# Patient Record
Sex: Female | Born: 1935 | Race: White | Hispanic: No | Marital: Married | Smoking: Former smoker
Health system: Southern US, Community
[De-identification: ages and names within clinical notes are randomized; demographics above are authoritative.]

## PROBLEM LIST (undated history)

## (undated) DIAGNOSIS — C439 Malignant melanoma of skin, unspecified: Secondary | ICD-10-CM

## (undated) DIAGNOSIS — J45909 Unspecified asthma, uncomplicated: Secondary | ICD-10-CM

## (undated) DIAGNOSIS — E7800 Pure hypercholesterolemia, unspecified: Secondary | ICD-10-CM

## (undated) DIAGNOSIS — J449 Chronic obstructive pulmonary disease, unspecified: Secondary | ICD-10-CM

## (undated) DIAGNOSIS — I10 Essential (primary) hypertension: Secondary | ICD-10-CM

## (undated) DIAGNOSIS — J302 Other seasonal allergic rhinitis: Secondary | ICD-10-CM

## (undated) DIAGNOSIS — I2510 Atherosclerotic heart disease of native coronary artery without angina pectoris: Secondary | ICD-10-CM

## (undated) HISTORY — PX: EYE SURGERY: SHX253

## (undated) HISTORY — PX: ABDOMINAL HYSTERECTOMY: SHX81

## (undated) HISTORY — DX: Malignant melanoma of skin, unspecified: C43.9

## (undated) HISTORY — DX: Essential (primary) hypertension: I10

## (undated) HISTORY — DX: Atherosclerotic heart disease of native coronary artery without angina pectoris: I25.10

## (undated) HISTORY — DX: Pure hypercholesterolemia, unspecified: E78.00

## (undated) HISTORY — DX: Other seasonal allergic rhinitis: J30.2

## (undated) HISTORY — PX: CHOLECYSTECTOMY: SHX55

## (undated) HISTORY — DX: Chronic obstructive pulmonary disease, unspecified: J44.9

## (undated) HISTORY — PX: OTHER SURGICAL HISTORY: SHX169

## (undated) HISTORY — PX: MELANOMA EXCISION: SHX5266

---

## 2015-03-28 ENCOUNTER — Ambulatory Visit (INDEPENDENT_AMBULATORY_CARE_PROVIDER_SITE_OTHER): Payer: Medicare Other | Admitting: Internal Medicine

## 2015-03-28 ENCOUNTER — Encounter (INDEPENDENT_AMBULATORY_CARE_PROVIDER_SITE_OTHER): Payer: Self-pay

## 2015-03-28 ENCOUNTER — Encounter: Payer: Self-pay | Admitting: Internal Medicine

## 2015-03-28 VITALS — BP 142/68 | HR 80 | Ht 63.0 in | Wt 148.0 lb

## 2015-03-28 DIAGNOSIS — Z87891 Personal history of nicotine dependence: Secondary | ICD-10-CM | POA: Diagnosis not present

## 2015-03-28 DIAGNOSIS — R0600 Dyspnea, unspecified: Secondary | ICD-10-CM | POA: Diagnosis not present

## 2015-03-28 DIAGNOSIS — J189 Pneumonia, unspecified organism: Secondary | ICD-10-CM | POA: Diagnosis not present

## 2015-03-28 DIAGNOSIS — Z23 Encounter for immunization: Secondary | ICD-10-CM

## 2015-03-28 NOTE — Progress Notes (Signed)
Subjective:    Patient ID: Katie Hunter, female    DOB: August 11, 1930, 81 y.o.   MRN: [id number]  PCP Brigid Re, FNP   HPI  IOV 03/28/2015  Chief Complaint  Patient presents with  . Pulmonary Consult    Pt is a self referral for COPD. Pt stated she was diagnosed with COPD when she went to an UC a couple months ago. Pt c/o DOE, prod cough with clear and thick mucus. Pt denies CP/tightness.     81 year old female former 50 pack smoker. Presents with her daughter who is a Equities trader. They both live in [place], [place]. Patient reports that prior to December 2015 she was well with doing normal activities of daily living and walking for exercise. She quit smoking many years ago as noted below. Then since December 2015 she is reporting recurrent episodes of pneumonia. Both she and daughter at test at least 5 episodes of pneumonia. His happens every 6 weeks. Each episode is characterized by cough, white sputum, shortness of breath, feeling tired, eating less and sleeping all the time. Generally this no fever except for the most recent episode. Apparently chest x-ray is done each time at the primary care physician's office in [place] [no outside records available for me at this visit] and these have consistently demonstrated pneumonia. She is treated with antibodies and prednisone and gets temporarily better. In between episodes she is fine but then gets sick again. Currently she is on a prednisone course due to end in a couple of days and she is worried that she will have pneumonia again  Recurrent pneumonia associated history -Denies any aspiration. Does have acid reflux for which she is on Nexium and it is well controlled. Denies any autoimmune disease. Denies any dysphagia. Denies any potential hypersensitivity pneumonitis history. She lives in an old house built in [place] but denies any mold, mildew. There is a basement but she does not go there and she denies that it has mold. She does  not have any birds in the house.  Spirometry in the office today - Under the influence of prednisone is normal  Radiologic evaluation  - Not available  Outside records  = Not available   has a past medical history of COPD (chronic obstructive pulmonary disease); Hypercholesteremia; Hypertension; Seasonal allergies; and Melanoma.   reports that she quit smoking about 20 years ago. Her smoking use included Cigarettes. She has a 50 pack-year smoking history. She has never used smokeless tobacco.  Past Surgical History  Procedure Laterality Date  . Melanoma excision    . Left toe amputation    . Abdominal hysterectomy    . Cholecystectomy    . Eye surgery      BIL x 6   . Other surgical history      eye lid lift  . Skin cancer removal      on left upper eye lid    Allergies  Allergen Reactions  . Penicillins     Itching     Immunization History  Administered Date(s) Administered  . Pneumococcal-Unspecified 07/02/2013    Family History  Problem Relation Age of Onset  . Emphysema Father   . Heart disease Mother      Current outpatient prescriptions:  .  albuterol (PROAIR HFA) 108 (90 BASE) MCG/ACT inhaler, Inhale 1-2 puffs into the lungs every 6 (six) hours as needed for wheezing or shortness of breath., Disp: , Rfl:  .  albuterol (PROVENTIL) (2.5 MG/3ML) 0.083% nebulizer solution,  Take 2.5 mg by nebulization every 6 (six) hours as needed for wheezing or shortness of breath., Disp: , Rfl:  .  amLODipine (NORVASC) 5 MG tablet, Take 5 mg by mouth daily., Disp: , Rfl:  .  Ascorbic Acid (VITAMIN C) 1000 MG tablet, Take 1,000 mg by mouth daily., Disp: , Rfl:  .  aspirin 81 MG tablet, Take 81 mg by mouth daily., Disp: , Rfl:  .  atorvastatin (LIPITOR) 10 MG tablet, Take 10 mg by mouth daily., Disp: , Rfl:  .  celecoxib (CELEBREX) 200 MG capsule, Take 200 mg by mouth daily., Disp: , Rfl:  .  cholecalciferol (VITAMIN D) 1000 UNITS tablet, Take 1,000 Units by mouth daily.,  Disp: , Rfl:  .  esomeprazole (NEXIUM) 40 MG capsule, Take 40 mg by mouth daily at 12 noon., Disp: , Rfl:  .  ezetimibe (ZETIA) 10 MG tablet, Take 10 mg by mouth daily., Disp: , Rfl:  .  furosemide (LASIX) 40 MG tablet, Take 40 mg by mouth., Disp: , Rfl:  .  losartan (COZAAR) 50 MG tablet, Take 50 mg by mouth daily., Disp: , Rfl:  .  Multiple Vitamin (MULTIVITAMIN) capsule, Take 1 capsule by mouth daily., Disp: , Rfl:  .  predniSONE (DELTASONE) 10 MG tablet, Take 10 mg by mouth daily with breakfast., Disp: , Rfl:  .  zinc gluconate 50 MG tablet, Take 50 mg by mouth daily., Disp: , Rfl:      Review of Systems  Constitutional: Negative for fever and unexpected weight change.  HENT: Negative for congestion, dental problem, ear pain, nosebleeds, postnasal drip, rhinorrhea, sinus pressure, sneezing, sore throat and trouble swallowing.   Eyes: Negative for redness and itching.  Respiratory: Positive for cough and shortness of breath. Negative for chest tightness and wheezing.   Cardiovascular: Negative for palpitations and leg swelling.  Gastrointestinal: Negative for nausea and vomiting.  Genitourinary: Negative for dysuria.  Musculoskeletal: Negative for joint swelling.  Skin: Negative for rash.  Neurological: Negative for headaches.  Hematological: Does not bruise/bleed easily.  Psychiatric/Behavioral: Negative for dysphoric mood. The patient is not nervous/anxious.        Objective:   Physical Exam  Constitutional: She is oriented to person, place, and time. She appears well-developed and well-nourished. No distress.  Body mass index is 26.22 kg/(m^2).   HENT:  Head: Normocephalic and atraumatic.  Right Ear: External ear normal.  Left Ear: External ear normal.  Mouth/Throat: Oropharynx is clear and moist. No oropharyngeal exudate.  Eyes: Conjunctivae and EOM are normal. Pupils are equal, round, and reactive to light. Right eye exhibits no discharge. Left eye exhibits no discharge.  No scleral icterus.  Neck: Normal range of motion. Neck supple. No JVD present. No tracheal deviation present. No thyromegaly present.  Cardiovascular: Normal rate, regular rhythm, normal heart sounds and intact distal pulses.  Exam reveals no gallop and no friction rub.   No murmur heard. Pulmonary/Chest: Effort normal and breath sounds normal. No respiratory distress. She has no wheezes. She has no rales. She exhibits no tenderness.  Abdominal: Soft. Bowel sounds are normal. She exhibits no distension and no mass. There is no tenderness. There is no rebound and no guarding.  Musculoskeletal: Normal range of motion. She exhibits no edema or tenderness.  Lymphadenopathy:    She has no cervical adenopathy.  Neurological: She is alert and oriented to person, place, and time. She has normal reflexes. No cranial nerve deficit. She exhibits normal muscle tone. Coordination normal.  Skin:  Skin is warm and dry. No rash noted. She is not diaphoretic. No erythema. No pallor.  Psychiatric: She has a normal mood and affect. Her behavior is normal. Judgment and thought content normal.  Vitals reviewed.   Filed Vitals:   03/28/15 1203  BP: 142/68  Pulse: 80  Height: 5\' 3"  (1.6 m)  Weight: 148 lb (67.132 kg)  SpO2: 95%   Body mass index is 26.22 kg/(m^2).        Assessment & Plan:     ICD-9-CM ICD-10-CM   1. Recurrent pneumonia 486 J18.9 Pulmonary function test     CT Chest High Resolution  2. Dyspnea 786.09 R06.00 Spirometry with Graph  3. History of smoking 25-50 pack years V15.82 Z87.891 Pulmonary function test     CT Chest High Resolution    Cause remains unexplained Possibilities include asthma, copd, aspiration, auto-immune, local lung anatomy problems, rare issues like hypersensitivity pneumoniitis  PLAN - PFT at [place] [facility] - Flu shot 03/28/2015 - will need old records from Emilie Rutter, [place], FNP esp CXR report/images  Followup  - call us as soon as you  complete the test [phone] and have the triage nurse send message to me for review and advise on next step    Dr. Brand Males, M.D., [facility].C.P Pulmonary and Critical Care Medicine Staff Physician [facility] Pager: [phone], If no answer or between  15:00h - 7:00h: call [phone]  03/28/2015 12:38 PM

## 2015-03-28 NOTE — Patient Instructions (Signed)
ICD-9-CM ICD-10-CM   1. Recurrent pneumonia 486 J18.9   2. Dyspnea 786.09 R06.00 Spirometry with Graph  3. History of smoking 25-50 pack years V15.82 Z87.891     Cause remains unexplained Possibilities include asthma, copd, aspiration, auto-immune, local lung anatomy problems, rare issues like hypersensitivity pneumoniitis  PLAN - PFT at [facility] - Flu shot 03/28/2015  Followup  - call us as soon as you complete the test [phone] and have the triage nurse send message to me for review and advise on next step

## 2015-04-01 ENCOUNTER — Ambulatory Visit (HOSPITAL_COMMUNITY)
Admission: RE | Admit: 2015-04-01 | Discharge: 2015-04-01 | Disposition: A | Discharge status: Home / Self Care | Payer: Medicare Other | Source: Ambulatory Visit | Attending: Internal Medicine | Admitting: Internal Medicine

## 2015-04-01 ENCOUNTER — Telehealth: Payer: Self-pay | Admitting: Internal Medicine

## 2015-04-01 DIAGNOSIS — Z87891 Personal history of nicotine dependence: Secondary | ICD-10-CM

## 2015-04-01 DIAGNOSIS — J189 Pneumonia, unspecified organism: Secondary | ICD-10-CM | POA: Diagnosis present

## 2015-04-01 LAB — PULMONARY FUNCTION TEST
DL/VA % pred: 92 %
DL/VA: 4.34 ml/min/mmHg/L
DLCO unc % pred: 62 %
DLCO unc: 14.33 ml/min/mmHg
FEF 25-75 Post: 2.05 L/sec
FEF 25-75 Pre: 1.05 L/sec
FEF2575-%CHANGE-POST: 95 %
FEF2575-%Pred-Post: 166 %
FEF2575-%Pred-Pre: 85 %
FEV1-%CHANGE-POST: 17 %
FEV1-%PRED-POST: 84 %
FEV1-%PRED-PRE: 71 %
FEV1-PRE: 1.26 L
FEV1-Post: 1.49 L
FEV1FVC-%CHANGE-POST: 4 %
FEV1FVC-%Pred-Pre: 105 %
FEV6-%Change-Post: 11 %
FEV6-%PRED-PRE: 72 %
FEV6-%Pred-Post: 80 %
FEV6-PRE: 1.63 L
FEV6-Post: 1.82 L
FEV6FVC-%PRED-PRE: 106 %
FEV6FVC-%Pred-Post: 106 %
FVC-%CHANGE-POST: 12 %
FVC-%PRED-PRE: 68 %
FVC-%Pred-Post: 76 %
FVC-POST: 1.84 L
FVC-PRE: 1.63 L
POST FEV1/FVC RATIO: 81 %
POST FEV6/FVC RATIO: 100 %
PRE FEV6/FVC RATIO: 100 %
Pre FEV1/FVC ratio: 77 %
RV % pred: 115 %
RV: 2.75 L
TLC % PRED: 92 %
TLC: 4.54 L

## 2015-04-01 MED ORDER — ALBUTEROL SULFATE (2.5 MG/3ML) 0.083% IN NEBU
2.5000 mg | INHALATION_SOLUTION | Freq: Once | RESPIRATORY_TRACT | Status: AC
  Administered 2015-04-01: 2.5 mg via RESPIRATORY_TRACT

## 2015-04-01 NOTE — Telephone Encounter (Signed)
Called and spoke with pt's daughter They were calling to notify MR that PFT and CT were done today She stated that MR had instructed them to call office when completed so FYI would be sent to MR  Will send message to MR as [facility]

## 2015-04-01 NOTE — Telephone Encounter (Signed)
Will reviewe and get back next week. Please let them know. SEnd note back to me

## 2015-04-04 MED ORDER — DOXYCYCLINE HYCLATE 100 MG PO TABS: 100.0000 mg | ORAL_TABLET | Freq: Two times a day (BID) | ORAL | Status: DC

## 2015-04-04 NOTE — Telephone Encounter (Signed)
Spoke with pt's daughter, tried to explain results to [place].  Pt has been scheduled with TP next Friday, the next available for pt's daughter to drive her.  Pt's daughter wishes to hold off on any referrals or orders until after speaking with TP.   abx has been sent to preferred pharmacy.  Nothing further needed at this time.

## 2015-04-04 NOTE — Telephone Encounter (Signed)
Pt's daughter, [name] is aware that once MR reviews these results we will call her. While on the phone with her she states that the pt is coughing up green mucus. She has since finished prednisone.  MR - please advise. Thanks.

## 2015-04-04 NOTE — Telephone Encounter (Signed)
Let patient/daughter know that there are several findings   1. There is some copd with asthma component - mild to moderate -  (CT and PFT) that can explain repeated infectrions. She needs to be on maintenance spiriva + symbicort/breo/advair/dulera. This shoul stop cycle of getting repeatedly sixk. For this best she comes in to see TP ASAP  2. No frank pneumopnia on CT  3. Given current green mucus - start Take doxycycline 100mg  po twice daily x 5 days; take after meals and avoid sunlight - possible nausea rriks. Will do prednisone if gets worse   4. Coronary artrery calcification + aoprtic valve calcification -r efer cardiology - she can see Dr Bronson Ing or Dr Harl Bowie in [place]  5. Some bronchiectasis RLL - can also explain frequent infection - #1 is startegy  6. Lung nodule 0- nneeds fu CT   Let them know that there are several findings all best discussed face to face so they do not feel overwhelmed. Give appt with TP    Ct Chest High Resolution  04/01/2015   CLINICAL DATA:  81 year old female with recurrent episodes of pneumonia approximately 5 times since December 2015. History of COPD. Former smoker with 25-50 pack-years. History of melanoma.  EXAM: CT CHEST WITHOUT CONTRAST  TECHNIQUE: Multidetector CT imaging of the chest was performed following the standard protocol without intravenous contrast. High resolution imaging of the lungs, as well as inspiratory and expiratory imaging, was performed.  COMPARISON:  No priors.  FINDINGS: Mediastinum/Lymph Nodes: Heart size is normal. There is no significant pericardial fluid, thickening or pericardial calcification. There is atherosclerosis of the thoracic aorta, the great vessels of the mediastinum and the coronary arteries, including calcified atherosclerotic plaque in the left main, left anterior descending, left circumflex and right coronary arteries. Extensive calcifications of the aortic valve. No pathologically enlarged mediastinal or  hilar lymph nodes. Please note that accurate exclusion of hilar adenopathy is limited on noncontrast CT scans. Small hiatal hernia. No axillary lymphadenopathy.  Lungs/Pleura: Inspiratory and expiratory images demonstrate some patchy areas of very mild ground-glass attenuation. This is most evident in the medial right lower lobe where there is some associated cylindrical and mild varicose bronchiectasis as well as some peripheral bronchiolectasis. Similar findings are present to a lesser degree in the right middle lobe. Scattered areas of mild cylindrical bronchiectasis are also noted in the upper lobes of the lungs bilaterally. No other areas of significant subpleural reticulation or frank honeycombing are noted. A few scattered areas of parenchymal banding, most evident in the inferior segment of the lingula. Inspiratory and expiratory imaging demonstrates some mild air trapping, indicative of small airways disease. No acute consolidative airspace disease. No pleural effusions. 8 x 4 mm (mean diameter 6 mm) subpleural nodule in the right middle lobe abutting the minor fissure. 5 mm subpleural nodule in the medial right lower lobe (image 43 of series 3). Mild diffuse bronchial wall thickening with very mild centrilobular emphysema.  Upper abdomen: Unremarkable.  Musculoskeletal: There are no aggressive appearing lytic or blastic lesions noted in the visualized portions of the skeleton.  IMPRESSION: 1. Scattered areas of mild cylindrical bronchiectasis in the lungs bilaterally. In the medial right lower lobe there is some associated peribronchovascular ground-glass attenuation and mild peripheral bronchiolectasis. These findings are nonspecific, but favored to reflect areas of mild post infectious or inflammatory fibrosis. Interstitial lung disease are noted at this time. Repeat high-resolution chest CT in 12 months is recommended to assess for temporal changes in the appearance of  the lung parenchyma (and to  followup pulmonary nodules discussed below). No other stigmata to strongly suggest 2. Small pulmonary nodules in the right lung, largest of which in the right middle lobe is a subpleural nodule which has a mean diameter of 6 mm associated with the minor fissure. Both of the nodules are favored to represent subpleural lymph nodes, but are nonspecific. Attention at time of followup high-resolution chest CT is recommended to ensure stability. 3. Mild diffuse bronchial wall thickening with mild centrilobular emphysema and evidence of mild air trapping; imaging findings compatible with the reported clinical history of COPD. 4. Atherosclerosis, including left main and 3 vessel coronary artery disease. 5. There are calcifications of the aortic valve. Echocardiographic correlation for evaluation of potential valvular dysfunction may be warranted if clinically indicated.   Electronically Signed   By: Vinnie Langton M.D.   On: 04/01/2015 16:00    reports that she quit smoking about 20 years ago. Her smoking use included Cigarettes. She has a 50 pack-year smoking history. She has never used smokeless tobacco.

## 2015-04-06 ENCOUNTER — Telehealth: Payer: Self-pay | Admitting: Internal Medicine

## 2015-04-06 MED ORDER — PREDNISONE 10 MG PO TABS: ORAL_TABLET | ORAL | Status: DC

## 2015-04-06 NOTE — Telephone Encounter (Signed)
Called spoke with Robin. Is aware of below. RX sent in. Nothing further needed

## 2015-04-06 NOTE — Telephone Encounter (Signed)
Called spoke with daughter, [name]. requesting to go over the CT results. I made her aware of previous phone note about results as she reports no one told her any of that and just scheduled her an appt. Pt did get the ABX. Pt is still having SOB and used rescue inhaler about 3 times already. Pt has pending appt with MR on 04/15/15. [name] wants to know if we can go ahead and call in some prednisone for pt. Please advise MR thanks

## 2015-04-06 NOTE — Telephone Encounter (Signed)
Ok to call me in Please take prednisone 40 mg x1 day, then 30 mg x1 day, then 20 mg x1 day, then 10 mg x1 day, and then 5 mg x1 day and stop

## 2015-04-08 ENCOUNTER — Encounter: Payer: Self-pay | Admitting: Adult Health

## 2015-04-08 ENCOUNTER — Ambulatory Visit (INDEPENDENT_AMBULATORY_CARE_PROVIDER_SITE_OTHER): Payer: Medicare Other | Admitting: Adult Health

## 2015-04-08 VITALS — BP 134/64 | HR 89 | Temp 98.2°F | Ht 63.0 in | Wt 145.0 lb

## 2015-04-08 DIAGNOSIS — J449 Chronic obstructive pulmonary disease, unspecified: Secondary | ICD-10-CM

## 2015-04-08 DIAGNOSIS — R911 Solitary pulmonary nodule: Secondary | ICD-10-CM

## 2015-04-08 MED ORDER — TIOTROPIUM BROMIDE MONOHYDRATE 1.25 MCG/ACT IN AERS: 2.0000 | INHALATION_SPRAY | Freq: Every day | RESPIRATORY_TRACT | Status: DC

## 2015-04-08 MED ORDER — BUDESONIDE-FORMOTEROL FUMARATE 80-4.5 MCG/ACT IN AERO: 2.0000 | INHALATION_SPRAY | Freq: Two times a day (BID) | RESPIRATORY_TRACT | Status: DC

## 2015-04-08 NOTE — Progress Notes (Signed)
   Subjective:    Patient ID: Katie Hunter, female    DOB: Dec 02, 1930, 81 y.o.   MRN: [id number]  HPI 81 yo female former smoker seen for pulmonary consult in 03/2015 for dyspnea.   04/08/2015 Follow up : Dyspnea  Pt returns follow up . She was seen last month for consult for dyspnea.  She is a former smoker , PFT showed mild COPD with asthma component.  PFT showed FEV1 84%, ratio 81, FVC 76, +BD  CT chest showed mild bronchiectasis , showed small pulmonary nodules in right lung . And mild COPD /emphysema. . We discussed adding inhaler to her regimen .  We discussed repeat CT chest in 6 months .  She recently started coughing up green mucus and was called in doxycycline and prednisone taper  She is starting to feel better.    Review of Systems Constitutional:   No  weight loss, night sweats,  Fevers, chills,  +fatigue, or  lassitude.  HEENT:   No headaches,  Difficulty swallowing,  Tooth/dental problems, or  Sore throat,                No sneezing, itching, ear ache, nasal congestion, post nasal drip,   CV:  No chest pain,  Orthopnea, PND, swelling in lower extremities, anasarca, dizziness, palpitations, syncope.   GI  No heartburn, indigestion, abdominal pain, nausea, vomiting, diarrhea, change in bowel habits, loss of appetite, bloody stools.   Resp:    No chest wall deformity  Skin: no rash or lesions.  GU: no dysuria, change in color of urine, no urgency or frequency.  No flank pain, no hematuria   MS:  No joint pain or swelling.  No decreased range of motion.  No back pain.  Psych:  No change in mood or affect. No depression or anxiety.  No memory loss.         Objective:   Physical Exam GEN: A/Ox3; pleasant , NAD, elderly   HEENT:  [place]/AT,  EACs-clear, TMs-wnl, NOSE-clear, THROAT-clear, no lesions, no postnasal drip or exudate noted.   NECK:  Supple w/ fair ROM; no JVD; normal carotid impulses w/o bruits; no thyromegaly or nodules palpated; no  lymphadenopathy.  RESP  Clear  P & A; w/o, wheezes/ rales/ or rhonchi.no accessory muscle use, no dullness to percussion  CARD:  RRR, no m/r/g  , no peripheral edema, pulses intact, no cyanosis or clubbing.  GI:   Soft & nt; nml bowel sounds; no organomegaly or masses detected.  Musco: Warm bil, no deformities or joint swelling noted.   Neuro: alert, no focal deficits noted.    Skin: Warm, no lesions or rashes        Assessment & Plan:

## 2015-04-08 NOTE — Patient Instructions (Addendum)
Finish Doxycycline and Prednisone .  Mucinex DM Twice daily  As needed  Cough/congestion  Continue on Oxygen 2l/m At bedtime  .  Begin Symbicort 2 puffs Twice daily  , rinse after use.  Begin Spiriva  2 puffs .daily  Check a CT chest in 6 months for lung nodules.  Follow up Dr. Chase Caller in 6 weeks and As needed   Please contact office for sooner follow up if symptoms do not improve or worsen or seek emergency care  Refer to cardiology as discussed.

## 2015-04-12 ENCOUNTER — Telehealth: Payer: Self-pay | Admitting: Adult Health

## 2015-04-12 NOTE — Telephone Encounter (Signed)
Attempted to contact patient, line busy x 2

## 2015-04-13 DIAGNOSIS — J449 Chronic obstructive pulmonary disease, unspecified: Secondary | ICD-10-CM | POA: Insufficient documentation

## 2015-04-13 DIAGNOSIS — R918 Other nonspecific abnormal finding of lung field: Secondary | ICD-10-CM | POA: Insufficient documentation

## 2015-04-13 NOTE — Assessment & Plan Note (Signed)
Check a CT chest in 6 months for lung nodules.  Follow up Dr. Chase Caller in 6 weeks and As needed   Please contact office for sooner follow up if symptoms do not improve or worsen or seek emergency care  Refer to cardiology as discussed.

## 2015-04-13 NOTE — Telephone Encounter (Signed)
Patients daughter states that TP put her mom on a couple of inhalers and she is having hard time breathing them in.  She wants to know if it would be better for her to use a spacer.  TP - please advise.

## 2015-04-13 NOTE — Assessment & Plan Note (Signed)
Finish Doxycycline and Prednisone .  Mucinex DM Twice daily  As needed  Cough/congestion  Continue on Oxygen 2l/m At bedtime  .  Begin Symbicort 2 puffs Twice daily  , rinse after use.  Begin Spiriva  2 puffs .daily  Follow up Dr. Chase Caller in 6 weeks and As needed   Please contact office for sooner follow up if symptoms do not improve or worsen or seek emergency care  Refer to cardiology as discussed.

## 2015-04-14 MED ORDER — AEROCHAMBER Z-STAT PLUS MISC: Status: DC

## 2015-04-14 NOTE — Telephone Encounter (Signed)
That is fine  thanks

## 2015-04-14 NOTE — Telephone Encounter (Signed)
Lestine Box- Returned call [phone]

## 2015-04-14 NOTE — Telephone Encounter (Signed)
Called and spoke with [name], pt's daughter she stated that she would like to have the aero chamber [facility] to [facility] [place], [place]. I explained to her that she could come to our [place] office to pick one up but she said it was to far of a drive for her after work. I sent rx to pharmacy and explained to her if they did not have one at the pharmacy to contact our office. She voiced understanding and had no further questions. Nothing further needed.

## 2015-04-14 NOTE — Telephone Encounter (Signed)
[id number] 

## 2015-04-15 ENCOUNTER — Ambulatory Visit: Payer: Medicare Other | Admitting: Adult Health

## 2015-04-18 ENCOUNTER — Ambulatory Visit: Payer: Medicare Other | Admitting: Cardiovascular Disease

## 2015-04-21 ENCOUNTER — Telehealth: Payer: Self-pay | Admitting: Internal Medicine

## 2015-04-21 MED ORDER — PREDNISONE 10 MG PO TABS: ORAL_TABLET | ORAL | Status: DC

## 2015-04-21 NOTE — Telephone Encounter (Signed)
Called and spoke with pt Pt concerned about the chest congestion she is having Pt stated that at last OV with TP new inhalers were given and now she is coughing up more clear phlegm Pt wants to know if this is normal and how long she should use inhalers before she starts to feel better Pt is requesting rec from MR of what to do  MR, please advise. Thanks

## 2015-04-21 NOTE — Telephone Encounter (Signed)
Called and spoke with pt Asked pt is she has PCP that could treat her for a possible UTI Pt stated that she did but they would not treat her over the phone, she would have to come in and she is too tired to come in Advised pt that MR would also request for he to be seen before ABT would be given Pt understood this and stated she would f/u with PCP  Nothing further is needed

## 2015-04-21 NOTE — Telephone Encounter (Signed)
Called pt and informed of MR rec Pt voiced understanding of rec and instructions of medication  RX sent electronically to pt's pharmacy   Nothing further is needed at this time

## 2015-04-21 NOTE — Telephone Encounter (Signed)
hjard to know if is due to inhlaer which could be a good thing or due to early onset of aecopd  Plan Continue inhalers Please start and take prednisone 40 mg x1 day, then 30 mg x1 day, then 20 mg x1 day, then 10 mg x1 day, and then 5 mg x1 day and stop

## 2015-04-27 ENCOUNTER — Encounter: Payer: Self-pay | Admitting: Cardiology

## 2015-04-27 ENCOUNTER — Encounter: Payer: Self-pay | Admitting: *Deleted

## 2015-04-27 ENCOUNTER — Ambulatory Visit (INDEPENDENT_AMBULATORY_CARE_PROVIDER_SITE_OTHER): Payer: Medicare Other | Admitting: Cardiology

## 2015-04-27 VITALS — BP 136/70 | HR 97 | Ht 63.0 in | Wt 144.1 lb

## 2015-04-27 DIAGNOSIS — Z136 Encounter for screening for cardiovascular disorders: Secondary | ICD-10-CM

## 2015-04-27 DIAGNOSIS — E785 Hyperlipidemia, unspecified: Secondary | ICD-10-CM

## 2015-04-27 DIAGNOSIS — R011 Cardiac murmur, unspecified: Secondary | ICD-10-CM

## 2015-04-27 DIAGNOSIS — I359 Nonrheumatic aortic valve disorder, unspecified: Secondary | ICD-10-CM

## 2015-04-27 DIAGNOSIS — I25810 Atherosclerosis of coronary artery bypass graft(s) without angina pectoris: Secondary | ICD-10-CM

## 2015-04-27 DIAGNOSIS — J449 Chronic obstructive pulmonary disease, unspecified: Secondary | ICD-10-CM

## 2015-04-27 NOTE — Progress Notes (Signed)
Cardiology Office Note  Date: 04/27/2015   ID: Katie Hunter, DOB 08/12/1930, MRN [id number]  PCP: Brigid Re, [place]  Consulting Cardiologist: Rozann Lesches, MD   Chief Complaint  Patient presents with  . Coronary atherosclerosis  . Aortic valve calcification    History of Present Illness:  Katie Hunter is an 81 y.o. female referred for cardiology consultation by Ms. Parrett NP with the pulmonary division. I reviewed the available records. She was evaluated recently with history of recurrent pneumonia, shortness of breath, long-standing tobacco use, and suspected COPD. She underwent chest CT imaging demonstrating abnormalities as reviewed below including coronary and vascular calcification as well as aortic valve calcification noted incidentally.  She is here today for further cardiac assessment. She does not report any exertional chest pain, has a fairly chronic history of recurring shortness of breath and chest congestion. Her medications have been recently adjusted including the addition of inhalers and steroids. She states that she is feeling much better in the last few days, no cough whatsoever.  She denies any known history of obstructive CAD or myocardial infarction. It sounds like she underwent some type of treadmill testing years ago for screening purposes, does not recall any abnormalities at that time. She may have also had an echocardiogram within the last few years, but I was not able to locate any results.  Today we discussed the results of her chest CT from a cardiac perspective. He tells me in retrospect that she has been told that she has a heart murmur over time.  We reviewed her medications which are outlined below. She has been on aspirin, Lipitor, Zetia, Norvasc, and Cozaar as part of a stable regimen.   Past Medical History  Diagnosis Date  . COPD (chronic obstructive pulmonary disease) ([place])   . Hypercholesteremia   . Essential hypertension   . Seasonal  allergies   . Melanoma ([place])     Left toe - amputated  . Coronary atherosclerosis     Documented by chest CT imaging October 2016    Past Surgical History  Procedure Laterality Date  . Melanoma excision    . Left toe amputation    . Abdominal hysterectomy    . Cholecystectomy    . Eye surgery      BIL x 6   . Other surgical history      Eye lid lift  . Skin cancer removal      Left upper eye lid    Current Outpatient Prescriptions  Medication Sig Dispense Refill  . albuterol (PROAIR HFA) 108 (90 BASE) MCG/ACT inhaler Inhale 1-2 puffs into the lungs every 6 (six) hours as needed for wheezing or shortness of breath.    Marland Kitchen albuterol (PROVENTIL) (2.5 MG/3ML) 0.083% nebulizer solution Take 2.5 mg by nebulization every 6 (six) hours as needed for wheezing or shortness of breath.    Marland Kitchen amLODipine (NORVASC) 5 MG tablet Take 5 mg by mouth daily.    . Ascorbic Acid (VITAMIN C) 1000 MG tablet Take 1,000 mg by mouth daily.    Marland Kitchen aspirin 81 MG tablet Take 81 mg by mouth daily.    Marland Kitchen atorvastatin (LIPITOR) 10 MG tablet Take 10 mg by mouth every other day.     . budesonide-formoterol (SYMBICORT) 80-4.5 MCG/ACT inhaler Inhale 2 puffs into the lungs 2 (two) times daily. 1 Inhaler 5  . celecoxib (CELEBREX) 200 MG capsule Take 200 mg by mouth daily.    . cholecalciferol (VITAMIN D) 1000 UNITS  tablet Take 1,000 Units by mouth daily.    Marland Kitchen doxycycline (VIBRA-TABS) 100 MG tablet Take 1 tablet (100 mg total) by mouth 2 (two) times daily. 10 tablet 0  . esomeprazole (NEXIUM) 40 MG capsule Take 40 mg by mouth daily at 12 noon.    . ezetimibe (ZETIA) 10 MG tablet Take 10 mg by mouth daily.    . furosemide (LASIX) 40 MG tablet Take 40 mg by mouth.    Marland Kitchen HYDROcodone-homatropine (HYCODAN) 5-1.5 MG/5ML syrup as needed.  0  . losartan (COZAAR) 50 MG tablet Take 50 mg by mouth daily.    . Multiple Vitamin (MULTIVITAMIN) capsule Take 1 capsule by mouth daily.    . predniSONE (DELTASONE) 10 MG tablet Take 10 mg by  mouth daily with breakfast.    . predniSONE (DELTASONE) 10 MG tablet 40 mg x1 day, then 30 mg x1 day, then 20 mg x1 day, then 10 mg x1 day, and then 5 mg x1 day and stop 12 tablet 0  . Spacer/Aero-Holding Chambers (AEROCHAMBER Z-STAT PLUS) inhaler Use as instructed 1 each 0  . Tiotropium Bromide Monohydrate (SPIRIVA RESPIMAT) 1.25 MCG/ACT AERS Inhale 2 puffs into the lungs daily. 1 Inhaler 5  . zinc gluconate 50 MG tablet Take 50 mg by mouth daily.     No current facility-administered medications for this visit.    Allergies:  Penicillins   Social History: The patient  reports that she quit smoking about 20 years ago. Her smoking use included Cigarettes. She has a 50 pack-year smoking history. She has never used smokeless tobacco. She reports that she does not drink alcohol or use illicit drugs.   Family History: The patient's family history includes Emphysema in her father; Heart disease in her mother.   ROS:  Please see the history of present illness. Otherwise, complete review of systems is positive for arthritic hip and knee pain, also left lower leg pain. No definite claudication..  All other systems are reviewed and negative.   Physical Exam: VS:  BP 136/70 mmHg  Pulse 97  Ht 5\' 3"  (1.6 m)  Wt 144 lb 1.6 oz (65.363 kg)  BMI 25.53 kg/m2  SpO2 90%, BMI Body mass index is 25.53 kg/(m^2).  Wt Readings from Last 3 Encounters:  04/27/15 144 lb 1.6 oz (65.363 kg)  04/08/15 145 lb (65.772 kg)  03/28/15 148 lb (67.132 kg)     General: Elderly woman, appears comfortable at rest. HEENT: Conjunctiva and lids normal, oropharynx clear. Neck: Supple, no elevated JVP or carotid bruits, no thyromegaly. Lungs: Diminished breath sounds without wheezing, nonlabored breathing at rest. Cardiac: Regular rate and rhythm, no S3, 2/6 systolic murmur at base with preserved second heart sound, no pericardial rub. Abdomen: Soft, nontender, bowel sounds present, no guarding or rebound. Extremities: No  pitting edema, distal pulses 1-2+. Skin: Warm and dry. Musculoskeletal: Mild kyphosis. Neuropsychiatric: Alert and oriented x3, affect grossly appropriate.   ECG: ECG is ordered today and shows sinus rhythm with PAC, low voltage, and decreased R wave progression.  Other Studies Reviewed Today:  Chest CT 04/01/2015: FINDINGS: Mediastinum/Lymph Nodes: Heart size is normal. There is no significant pericardial fluid, thickening or pericardial calcification. There is atherosclerosis of the thoracic aorta, the great vessels of the mediastinum and the coronary arteries, including calcified atherosclerotic plaque in the left main, left anterior descending, left circumflex and right coronary arteries. Extensive calcifications of the aortic valve. No pathologically enlarged mediastinal or hilar lymph nodes. Please note that accurate exclusion of hilar  adenopathy is limited on noncontrast CT scans. Small hiatal hernia. No axillary lymphadenopathy.  Lungs/Pleura: Inspiratory and expiratory images demonstrate some patchy areas of very mild ground-glass attenuation. This is most evident in the medial right lower lobe where there is some associated cylindrical and mild varicose bronchiectasis as well as some peripheral bronchiolectasis. Similar findings are present to a lesser degree in the right middle lobe. Scattered areas of mild cylindrical bronchiectasis are also noted in the upper lobes of the lungs bilaterally. No other areas of significant subpleural reticulation or frank honeycombing are noted. A few scattered areas of parenchymal banding, most evident in the inferior segment of the lingula. Inspiratory and expiratory imaging demonstrates some mild air trapping, indicative of small airways disease. No acute consolidative airspace disease. No pleural effusions. 8 x 4 mm (mean diameter 6 mm) subpleural nodule in the right middle lobe abutting the minor fissure. 5 mm subpleural nodule in  the medial right lower lobe (image 43 of series 3). Mild diffuse bronchial wall thickening with very mild centrilobular emphysema.  Upper abdomen: Unremarkable.  Musculoskeletal: There are no aggressive appearing lytic or blastic lesions noted in the visualized portions of the skeleton.  IMPRESSION: 1. Scattered areas of mild cylindrical bronchiectasis in the lungs bilaterally. In the medial right lower lobe there is some associated peribronchovascular ground-glass attenuation and mild peripheral bronchiolectasis. These findings are nonspecific, but favored to reflect areas of mild post infectious or inflammatory fibrosis. Interstitial lung disease are noted at this time. Repeat high-resolution chest CT in 12 months is recommended to assess for temporal changes in the appearance of the lung parenchyma (and to followup pulmonary nodules discussed below). No other stigmata to strongly suggest 2. Small pulmonary nodules in the right lung, largest of which in the right middle lobe is a subpleural nodule which has a mean diameter of 6 mm associated with the minor fissure. Both of the nodules are favored to represent subpleural lymph nodes, but are nonspecific. Attention at time of followup high-resolution chest CT is recommended to ensure stability. 3. Mild diffuse bronchial wall thickening with mild centrilobular emphysema and evidence of mild air trapping; imaging findings compatible with the reported clinical history of COPD. 4. Atherosclerosis, including left main and 3 vessel coronary artery disease. 5. There are calcifications of the aortic valve. Echocardiographic correlation for evaluation of potential valvular dysfunction may be warranted if clinically indicated.  ASSESSMENT AND PLAN:  1. Multivessel coronary artery calcification based on recent chest CT imaging. She does not endorse any definite angina symptoms, and has fairly chronic recurring shortness of breath which  is presumably pulmonary in etiology based on response to inhalers and steroids. Baseline ECG obtained today shows nonspecific findings. She has not undergone any recent ischemic evaluation, and we discussed proceeding with a Lexiscan Cardiolite to better understand ischemic burden, particular with multivessel distribution coronary atherosclerosis. Otherwise her current medications look fairly good from a cardiac perspective. We will call her with the results.  2. Aortic valve calcification and history of heart murmur. This is most suggestive of aortic valve sclerosis with preserved second heart sound. An echocardiogram will be obtained to further clarify.  3. Hyperlipidemia, on Lipitor and Zetia. She continues to follow with primary care. Would aim for fairly aggressive LDL control around 70 if possible.  4. Essential hypertension, blood pressure control is reasonable today. No changes were made.  Current medicines were reviewed at length with the patient today.   Orders Placed This Encounter  Procedures  . NM Myocar  Multi W/Spect W/Wall Motion / EF  . Myocardial Perfusion Imaging  . EKG 12-Lead  . Echocardiogram    Disposition: Call with results.   Signed, Satira Sark, MD, [facility] 04/27/2015 3:53 PM    [facility] [facility] [address] [place], [place] [address] Phone: [phone]; Fax: [phone]

## 2015-04-27 NOTE — Patient Instructions (Signed)
Your physician recommends that you schedule a follow-up appointment : We will call you with your results.   Your physician recommends that you continue on your current medications as directed. Please refer to the Current Medication list given to you today.  Your physician has requested that you have a lexiscan myoview. For further information please visit [email or web address]. Please follow instruction sheet, as given.  Your physician has requested that you have an echocardiogram. Echocardiography is a painless test that uses sound waves to create images of your heart. It provides your doctor with information about the size and shape of your heart and how well your heart's chambers and valves are working. This procedure takes approximately one hour. There are no restrictions for this procedure.  If you need a refill on your cardiac medications before your next appointment, please call your pharmacy.  Thank you for choosing [facility]!

## 2015-05-04 ENCOUNTER — Other Ambulatory Visit (HOSPITAL_COMMUNITY): Payer: Medicare Other

## 2015-05-06 ENCOUNTER — Encounter (HOSPITAL_COMMUNITY)
Admission: RE | Admit: 2015-05-06 | Discharge: 2015-05-06 | Disposition: A | Discharge status: Home / Self Care | Payer: Medicare Other | Source: Ambulatory Visit | Attending: Cardiology | Admitting: Cardiology

## 2015-05-06 ENCOUNTER — Encounter (HOSPITAL_COMMUNITY)
Admission: RE | Admit: 2015-05-06 | Discharge: 2015-05-06 | Disposition: A | Discharge status: Home / Self Care | Payer: Medicare Other | Source: Ambulatory Visit | Attending: Nurse Practitioner | Admitting: Nurse Practitioner

## 2015-05-06 ENCOUNTER — Inpatient Hospital Stay (HOSPITAL_COMMUNITY): Admission: RE | Admit: 2015-05-06 | Payer: Medicare Other | Source: Ambulatory Visit

## 2015-05-06 ENCOUNTER — Encounter (HOSPITAL_COMMUNITY): Payer: Self-pay

## 2015-05-06 DIAGNOSIS — I25810 Atherosclerosis of coronary artery bypass graft(s) without angina pectoris: Secondary | ICD-10-CM | POA: Insufficient documentation

## 2015-05-06 DIAGNOSIS — R011 Cardiac murmur, unspecified: Secondary | ICD-10-CM | POA: Diagnosis present

## 2015-05-06 HISTORY — DX: Unspecified asthma, uncomplicated: J45.909

## 2015-05-06 LAB — NM MYOCAR MULTI W/SPECT W/WALL MOTION / EF
CHL CUP NUCLEAR SSS: 2
CSEPPHR: 108 {beats}/min
LHR: 0.33
LV dias vol: 68 mL
LVSYSVOL: 38 mL
NUC STRESS TID: 1.14
Rest HR: 71 {beats}/min
SDS: 1
SRS: 1

## 2015-05-06 MED ORDER — TECHNETIUM TC 99M SESTAMIBI GENERIC - CARDIOLITE
30.0000 | Freq: Once | INTRAVENOUS | Status: AC | PRN
  Administered 2015-05-06: 30 via INTRAVENOUS

## 2015-05-06 MED ORDER — SODIUM CHLORIDE 0.9 % IJ SOLN
INTRAMUSCULAR | Status: AC
  Filled 2015-05-06: qty 36

## 2015-05-06 MED ORDER — TECHNETIUM TC 99M SESTAMIBI - CARDIOLITE
10.0000 | Freq: Once | INTRAVENOUS | Status: AC | PRN
  Administered 2015-05-06: 9 via INTRAVENOUS

## 2015-05-06 MED ORDER — REGADENOSON 0.4 MG/5ML IV SOLN
INTRAVENOUS | Status: AC
  Administered 2015-05-06: 0.4 mg via INTRAVENOUS
  Filled 2015-05-06: qty 5

## 2015-05-06 MED ORDER — SODIUM CHLORIDE 0.9 % IJ SOLN
INTRAMUSCULAR | Status: AC
  Administered 2015-05-06: 10 mL via INTRAVENOUS
  Filled 2015-05-06: qty 3

## 2015-05-24 ENCOUNTER — Encounter: Payer: Self-pay | Admitting: Internal Medicine

## 2015-05-24 ENCOUNTER — Ambulatory Visit (INDEPENDENT_AMBULATORY_CARE_PROVIDER_SITE_OTHER): Payer: Medicare Other | Admitting: Internal Medicine

## 2015-05-24 VITALS — BP 148/70 | HR 83 | Ht 63.0 in | Wt 150.8 lb

## 2015-05-24 DIAGNOSIS — I25810 Atherosclerosis of coronary artery bypass graft(s) without angina pectoris: Secondary | ICD-10-CM | POA: Diagnosis not present

## 2015-05-24 DIAGNOSIS — J018 Other acute sinusitis: Secondary | ICD-10-CM | POA: Diagnosis not present

## 2015-05-24 DIAGNOSIS — J449 Chronic obstructive pulmonary disease, unspecified: Secondary | ICD-10-CM | POA: Diagnosis not present

## 2015-05-24 DIAGNOSIS — R911 Solitary pulmonary nodule: Secondary | ICD-10-CM | POA: Diagnosis not present

## 2015-05-24 MED ORDER — AZITHROMYCIN 250 MG PO TABS: ORAL_TABLET | ORAL | Status: DC

## 2015-05-24 NOTE — Progress Notes (Signed)
Subjective:     Patient ID: Katie Hunter, female   DOB: Apr 07, 1931, 81 y.o.   MRN: [id number]  HPI  IOV 03/28/2015  Chief Complaint  Patient presents with  . Pulmonary Consult    Pt is a self referral for COPD. Pt stated she was diagnosed with COPD when she went to an UC a couple months ago. Pt c/o DOE, prod cough with clear and thick mucus. Pt denies CP/tightness.     81 year old female former 50 pack smoker. Presents with her daughter who is a Equities trader. They both live in [place], [place]. Patient reports that prior to December 2015 she was well with doing normal activities of daily living and walking for exercise. She quit smoking many years ago as noted below. Then since December 2015 she is reporting recurrent episodes of pneumonia. Both she and daughter at test at least 5 episodes of pneumonia. His happens every 6 weeks. Each episode is characterized by cough, white sputum, shortness of breath, feeling tired, eating less and sleeping all the time. Generally this no fever except for the most recent episode. Apparently chest x-ray is done each time at the primary care physician's office in [place] [no outside records available for me at this visit] and these have consistently demonstrated pneumonia. She is treated with antibodies and prednisone and gets temporarily better. In between episodes she is fine but then gets sick again. Currently she is on a prednisone course due to end in a couple of days and she is worried that she will have pneumonia again  Recurrent pneumonia associated history -Denies any aspiration. Does have acid reflux for which she is on Nexium and it is well controlled. Denies any autoimmune disease. Denies any dysphagia. Denies any potential hypersensitivity pneumonitis history. She lives in an old house built in [place] but denies any mold, mildew. There is a basement but she does not go there and she denies that it has mold. She does not have any birds in the  house.  Spirometry in the office today - Under the influence of prednisone is normal  Radiologic evaluation  - Not available  04/08/2015 Follow up : Dyspnea  Pt returns follow up . She was seen last month for consult for dyspnea.  She is a former smoker , PFT showed mild COPD with asthma component.  PFT showed FEV1 84%, ratio 81, FVC 76, +BD  CT chest showed mild bronchiectasis , showed small pulmonary nodules in right lung . And mild COPD /emphysema. . We discussed adding inhaler to her regimen .  We discussed repeat CT chest in 6 months .  She recently started coughing up green mucus and was called in doxycycline and prednisone taper  She is starting to feel better.    OV 05/24/2015  Chief Complaint  Patient presents with  . Follow-up    Pt here for 6 week f/u. Pt states her breathing is doing well. Pt states she has a prod cough with yellow mucus in morning then clear mucus throughout the day. Pt denies CP/tightness.    Follow-up mild-moderate COPD in this previous smoker. Positive bronchodilator response Follow-up lung nodules  She presents with her daughter Katie Hunter. They both live in [place]. Reports that after seeing nurse practitioner and getting started on triple inhaler therapy she is much better. She's really thankful. Currently doing well without much symptoms. Her other daughter has been diagnosed with colon cancer stage III and she struggling with this. In addition for the last day or  2 she's having some sinus congestion associated with and or drainage but there are no symptoms of COPD exacerbation. She is up-to-date with her vaccines. Wants handicapped stiocker    CAT COPD Symptom & Quality of Life Score ([employer] trademark) 0 is no burden. 5 is highest burden 05/24/2015   Never Cough -> Cough all the time 2  No phlegm in chest -> Chest is full of phlegm 3  No chest tightness -> Chest feels very tight 0  No dyspnea for 1 flight stairs/hill -> Very dyspneic for 1 flight of  stairs 2  No limitations for ADL at home -> Very limited with ADL at home 2  Confident leaving home -> Not at all confident leaving home 2  Sleep soundly -> Do not sleep soundly because of lung condition 1  Lots of Energy -> No energy at all 0  TOTAL Score (max 40)  12      Immunization History  Administered Date(s) Administered  . Influenza,inj,Quad PF,36+ Mos 03/28/2015  . Pneumococcal-Unspecified 07/02/2013    Allergies  Allergen Reactions  . Penicillins     Itching      Current outpatient prescriptions:  .  albuterol (PROAIR HFA) 108 (90 BASE) MCG/ACT inhaler, Inhale 1-2 puffs into the lungs every 6 (six) hours as needed for wheezing or shortness of breath., Disp: , Rfl:  .  albuterol (PROVENTIL) (2.5 MG/3ML) 0.083% nebulizer solution, Take 2.5 mg by nebulization every 6 (six) hours as needed for wheezing or shortness of breath., Disp: , Rfl:  .  amLODipine (NORVASC) 5 MG tablet, Take 5 mg by mouth daily., Disp: , Rfl:  .  Ascorbic Acid (VITAMIN C) 1000 MG tablet, Take 1,000 mg by mouth daily., Disp: , Rfl:  .  aspirin 81 MG tablet, Take 81 mg by mouth daily., Disp: , Rfl:  .  atorvastatin (LIPITOR) 10 MG tablet, Take 10 mg by mouth every other day. , Disp: , Rfl:  .  budesonide-formoterol (SYMBICORT) 80-4.5 MCG/ACT inhaler, Inhale 2 puffs into the lungs 2 (two) times daily., Disp: 1 Inhaler, Rfl: 5 .  celecoxib (CELEBREX) 200 MG capsule, Take 200 mg by mouth daily., Disp: , Rfl:  .  cholecalciferol (VITAMIN D) 1000 UNITS tablet, Take 1,000 Units by mouth daily., Disp: , Rfl:  .  esomeprazole (NEXIUM) 40 MG capsule, Take 40 mg by mouth daily at 12 noon., Disp: , Rfl:  .  ezetimibe (ZETIA) 10 MG tablet, Take 10 mg by mouth daily., Disp: , Rfl:  .  furosemide (LASIX) 40 MG tablet, Take 40 mg by mouth., Disp: , Rfl:  .  losartan (COZAAR) 50 MG tablet, Take 50 mg by mouth daily., Disp: , Rfl:  .  Multiple Vitamin (MULTIVITAMIN) capsule, Take 1 capsule by mouth daily., Disp: ,  Rfl:  .  Spacer/Aero-Holding Chambers (AEROCHAMBER Z-STAT PLUS) inhaler, Use as instructed, Disp: 1 each, Rfl: 0 .  Tiotropium Bromide Monohydrate (SPIRIVA RESPIMAT) 1.25 MCG/ACT AERS, Inhale 2 puffs into the lungs daily., Disp: 1 Inhaler, Rfl: 5 .  zinc gluconate 50 MG tablet, Take 50 mg by mouth daily., Disp: , Rfl:    Review of Systems According to history of present illness    Objective:   Physical Exam  Constitutional: She is oriented to person, place, and time. She appears well-developed and well-nourished. No distress.  HENT:  Head: Normocephalic and atraumatic.  Right Ear: External ear normal.  Left Ear: External ear normal.  Mouth/Throat: Oropharynx is clear and moist. No oropharyngeal exudate.  Nasal twang Postnasal drainage present  Eyes: Conjunctivae and EOM are normal. Pupils are equal, round, and reactive to light. Right eye exhibits no discharge. Left eye exhibits no discharge. No scleral icterus.  Neck: Normal range of motion. Neck supple. No JVD present. No tracheal deviation present. No thyromegaly present.  Cardiovascular: Normal rate, regular rhythm and intact distal pulses.  Exam reveals no gallop and no friction rub.   Murmur heard. Systolic murmur - baseline per patient  Pulmonary/Chest: Effort normal and breath sounds normal. No respiratory distress. She has no wheezes. She has no rales. She exhibits no tenderness.  Abdominal: Soft. Bowel sounds are normal. She exhibits no distension and no mass. There is no tenderness. There is no rebound and no guarding.  Musculoskeletal: Normal range of motion. She exhibits no edema or tenderness.  Lymphadenopathy:    She has no cervical adenopathy.  Neurological: She is alert and oriented to person, place, and time. She has normal reflexes. No cranial nerve deficit. She exhibits normal muscle tone. Coordination normal.  Skin: Skin is warm and dry. No rash noted. She is not diaphoretic. No erythema. No pallor.  Psychiatric:  She has a normal mood and affect. Her behavior is normal. Judgment and thought content normal.  Vitals reviewed.  Filed Vitals:   05/24/15 1647  BP: 148/70  Pulse: 83  Height: 5\' 3"  (1.6 m)  Weight: 150 lb 12.8 oz (68.402 kg)  SpO2: 96%         Assessment:       ICD-9-CM ICD-10-CM   1. Other acute sinusitis 461.8 J01.80   2. Chronic obstructive pulmonary disease, unspecified COPD type ([place]) 496 J44.9   3. Lung nodule 793.11 R91.1        Plan:      Take Z-pak Cotninue spiriva and symbicort daily Continue albuterol as needed Do repeat CT chest wo contrast July 2017 Monitor for copd flare up at all times [facility] for handicap sticker  Followup  - July 2017 CT chest - July 2017 after ct chest to see Dr Chase Caller or sooner if needed   Dr. Brand Males, M.D., [facility].C.P Pulmonary and Critical Care Medicine Staff Physician [facility] Pager: [phone], If no answer or between  15:00h - 7:00h: call [phone]  05/24/2015 5:31 PM

## 2015-05-24 NOTE — Patient Instructions (Addendum)
ICD-9-CM ICD-10-CM   1. Other acute sinusitis 461.8 J01.80   2. Chronic obstructive pulmonary disease, unspecified COPD type ([place]) 496 J44.9   3. Lung nodule 793.11 R91.1    Take Z-pak Cotninue spiriva and symbicort daily Continue albuterol as needed Do repeat CT chest wo contrast July 2017 Monitor for copd flare up at all times  Followup  - July 2017 CT chest - July 2017 after ct chest to see Dr Chase Caller or sooner if needed

## 2015-05-31 ENCOUNTER — Telehealth: Payer: Self-pay | Admitting: Internal Medicine

## 2015-06-01 ENCOUNTER — Telehealth: Payer: Self-pay | Admitting: Internal Medicine

## 2015-06-01 NOTE — Telephone Encounter (Signed)
If phlegm is clear, then more Abx not required. Can use mucinex to loosen secretions & OTC decongestant such as claritin-D

## 2015-06-01 NOTE — Telephone Encounter (Signed)
Received assistance forms. Called and spoke to pt's daughter. The income information that we received is illegible. Informed pt's daughter that a the income information will need to be re-faxed. Pt's daughter verbalized understanding and stated she would send in pt's W2. Will await information.

## 2015-06-01 NOTE — Telephone Encounter (Signed)
I have looked in MR look at and up front and the pt assistance forms are not there. Katie Dan do you have these forms?

## 2015-06-01 NOTE — Telephone Encounter (Signed)
Pt saw MR on 11/22 and was given zpak and it did not help. C/o nasal cong, prod cough (clear phlem), PND. Requesting further recs. MR not available. Will forward to DOD. Please advise Dr. Elsworth Soho thanks

## 2015-06-01 NOTE — Telephone Encounter (Signed)
Called spoke with pt. Aware of below. She verbalized understanding and needed nothing further

## 2015-06-02 NOTE — Telephone Encounter (Signed)
Checked MR's look at, front office folder, and spoke with Daneil Dan and forms have not been received.  Awaiting W2 forms

## 2015-06-03 NOTE — Telephone Encounter (Signed)
Spoke with patient's daughter, she said that she has not sent the W2's yet.  She said that she can only fax them from work and she forgot them today, so she will fax them on Monday.

## 2015-06-06 NOTE — Telephone Encounter (Signed)
Spoke with patient's daughter, she tried to fax the forms this morning but said that our fax was very busy, so she wasn't sure if it went through.  I did not see it on the fax nor in MR's folder.  Advised daughter to fax again.  Awaiting fax.

## 2015-06-07 NOTE — Telephone Encounter (Signed)
Daneil Dan please advise if these were received. Thanks.

## 2015-06-08 MED ORDER — BUDESONIDE-FORMOTEROL FUMARATE 80-4.5 MCG/ACT IN AERO: 2.0000 | INHALATION_SPRAY | Freq: Two times a day (BID) | RESPIRATORY_TRACT | Status: DC

## 2015-06-08 MED ORDER — TIOTROPIUM BROMIDE MONOHYDRATE 1.25 MCG/ACT IN AERS: 2.0000 | INHALATION_SPRAY | Freq: Every day | RESPIRATORY_TRACT | Status: DC

## 2015-06-08 NOTE — Telephone Encounter (Signed)
Received information regarding patient assistance from patient's daughter.  Forms have been completed and RX's have been printed out for MR to sign.  Placed in MR's box. There are 2 separate forms for 2 separate medications (Symbicort and Spiriva) both need to be signed and faxed. Will hold in Elise's box until complete

## 2015-06-09 NOTE — Telephone Encounter (Signed)
Rxs will be signed with MR returns to the office on 06/17/15.

## 2015-06-16 NOTE — Telephone Encounter (Signed)
Called and spoke to pt's daughter. Informed her the pt assistance forms have been completed and faxed along with placing pt's handicap placard in outgoing mail. Pt's daughter verbalized understanding and denied any further questions or concerns at this time.

## 2015-06-22 ENCOUNTER — Telehealth: Payer: Self-pay | Admitting: Internal Medicine

## 2015-06-22 NOTE — Telephone Encounter (Signed)
Left message with Rise Paganini. Gave verbal order for 90 day supply of Spiriva Respimat. Nothing further was needed at this time.

## 2015-08-17 ENCOUNTER — Telehealth: Payer: Self-pay | Admitting: Internal Medicine

## 2015-08-17 MED ORDER — DOXYCYCLINE HYCLATE 100 MG PO TABS: 100.0000 mg | ORAL_TABLET | Freq: Two times a day (BID) | ORAL | Status: DC

## 2015-08-17 NOTE — Telephone Encounter (Signed)
Rx has been sent in. Pt is aware. Nothing further was needed. 

## 2015-08-17 NOTE — Telephone Encounter (Signed)
Try doxy Take doxycycline 100mg  po twice daily x 5 days; take after meals and avoid sunlight  But if not working call back for prednisone esp if wheezing ormore dyspenic    Allergies  Allergen Reactions  . Penicillins     Itching

## 2015-08-17 NOTE — Telephone Encounter (Signed)
Spoke with pt. States that she is coughing up green/yellow mucus and has sinus congestion. Denies chest tightness, wheezing, SOB or fever. Onset was 3 days ago. Has been taking Mucinex with minimal relief. She would like to have an antibiotic sent in if possible.  MR - please advise. Thanks.

## 2015-10-10 ENCOUNTER — Ambulatory Visit (HOSPITAL_COMMUNITY)
Admission: RE | Admit: 2015-10-10 | Discharge: 2015-10-10 | Disposition: A | Discharge status: Home / Self Care | Payer: Medicare Other | Source: Ambulatory Visit | Attending: Adult Health | Admitting: Adult Health

## 2015-10-10 DIAGNOSIS — K449 Diaphragmatic hernia without obstruction or gangrene: Secondary | ICD-10-CM | POA: Insufficient documentation

## 2015-10-10 DIAGNOSIS — R911 Solitary pulmonary nodule: Secondary | ICD-10-CM

## 2015-10-10 DIAGNOSIS — I2510 Atherosclerotic heart disease of native coronary artery without angina pectoris: Secondary | ICD-10-CM | POA: Diagnosis not present

## 2015-10-10 DIAGNOSIS — R918 Other nonspecific abnormal finding of lung field: Secondary | ICD-10-CM | POA: Diagnosis not present

## 2015-10-13 ENCOUNTER — Other Ambulatory Visit: Payer: Self-pay | Admitting: Adult Health

## 2015-10-13 DIAGNOSIS — R911 Solitary pulmonary nodule: Secondary | ICD-10-CM

## 2015-10-13 NOTE — Progress Notes (Signed)
Quick Note:  Called and spoke with pt. Reviewed results and recs. Pt voiced understanding and had no further questions. ______ 

## 2015-11-09 ENCOUNTER — Telehealth: Payer: Self-pay | Admitting: Internal Medicine

## 2015-11-09 NOTE — Telephone Encounter (Signed)
Pt has not been using Oxygen x 3 months. Pt states that she has not needed to use it and wants to know if DME can pick it up. Pt is wanting to d/c this is MR feels okay to d/c.  Pt states that she is going up to [place] for 4 days on 11/17/15 to visit family and is concerned with going out of town without the O2 -- wants to wait to d/c O2 until she gets back.  Wants a small portable tank or concentrator to take with her to use if needed while in [place]. Please advise MR. Thanks.

## 2015-11-10 NOTE — Telephone Encounter (Signed)
LM for pt

## 2015-11-10 NOTE — Telephone Encounter (Signed)
Ok fine her choice

## 2015-11-11 NOTE — Telephone Encounter (Signed)
LM for pt

## 2015-11-11 NOTE — Telephone Encounter (Signed)
Spoke with pt.  She will call office back once she is ready to d/c o2.   Pt states she will contact DME to discuss o2 needs for her upcoming travel and will call office back if anything further is needed.

## 2015-11-11 NOTE — Telephone Encounter (Signed)
Patient returning our call-prm  °

## 2016-01-16 ENCOUNTER — Telehealth: Payer: Self-pay | Admitting: Internal Medicine

## 2016-01-16 MED ORDER — PREDNISONE 10 MG PO TABS: ORAL_TABLET | ORAL | Status: DC

## 2016-01-16 MED ORDER — DOXYCYCLINE HYCLATE 100 MG PO TABS: 100.0000 mg | ORAL_TABLET | Freq: Two times a day (BID) | ORAL | Status: DC

## 2016-01-16 NOTE — Telephone Encounter (Signed)
Take doxycycline 100mg  po twice daily x 5 days; take after meals and avoid sunlight  Please take prednisone 40 mg x1 day, then 30 mg x1 day, then 20 mg x1 day, then 10 mg x1 day, and then 5 mg x1 day and stop   Smp c/w aecopd    reports that she quit smoking about 21 years ago. Her smoking use included Cigarettes. She has a 50 pack-year smoking history. She has never used smokeless tobacco.

## 2016-01-16 NOTE — Telephone Encounter (Signed)
Pt c/o sinus drainage (yellow- clear), occas prod cough (yellow), sorethroat, occas wheezing.  Denies fever or SOB.  Pt unable to come for appt today due to transportation.  Please advise.  Allergies  Allergen Reactions  . Penicillins     Itching

## 2016-01-16 NOTE — Telephone Encounter (Signed)
Spoke with pt. Aware of recs. Rx's sent in. Nothing further needed 

## 2016-03-15 ENCOUNTER — Telehealth: Payer: Self-pay | Admitting: Internal Medicine

## 2016-03-15 MED ORDER — PREDNISONE 10 MG PO TABS: ORAL_TABLET | ORAL | 0 refills | Status: DC

## 2016-03-15 MED ORDER — DOXYCYCLINE HYCLATE 100 MG PO TABS: 100.0000 mg | ORAL_TABLET | Freq: Two times a day (BID) | ORAL | 0 refills | Status: DC

## 2016-03-15 NOTE — Telephone Encounter (Signed)
Spoke with pt. She is aware of MR's recommendations. Rxs have been sent in. Nothing further was needed. 

## 2016-03-15 NOTE — Telephone Encounter (Signed)
Please take prednisone 40 mg x1 day, then 30 mg x1 day, then 20 mg x1 day, then 10 mg x1 day, and then 5 mg x1 day and stop  Take doxycycline 100mg  po twice daily x 5 days; take after meals and avoid sunlight  She needs fu first avail fine  Allergies  Allergen Reactions  . Penicillins     Itching

## 2016-03-15 NOTE — Telephone Encounter (Signed)
Spoke with pt and she states that she has increased nasal/sinus congestion and cough with mostly clear to yellow mucus. Pt denies ShOB/wheeze/chest tightness or f/n/v. Pt has been taking Mucinex and using inhalers as directed. Pt would like to know what else you would recommend.   MR - Please advise. Thanks!

## 2016-03-22 ENCOUNTER — Telehealth: Payer: Self-pay | Admitting: Internal Medicine

## 2016-03-22 MED ORDER — AZITHROMYCIN 250 MG PO TABS: ORAL_TABLET | ORAL | 0 refills | Status: DC

## 2016-03-22 MED ORDER — PREDNISONE 10 MG PO TABS: ORAL_TABLET | ORAL | 0 refills | Status: DC

## 2016-03-22 NOTE — Telephone Encounter (Signed)
Called and spoke with pt and she stated that she was given doxy and pred on 9/14 and she finished these but she is still coughing with yellow/clear sputum.  She stated that she still sounds very congested and denies a fever, but she feels very run down and no energy.  Pt wanted to see what else she needs to try.  MR please advise. Thanks  Allergies  Allergen Reactions  . Penicillins     Itching

## 2016-03-22 NOTE — Telephone Encounter (Signed)
   Is she on dialy azithro?  If so , hold. If not, do Z pak and another Please take prednisone 40 mg x1 day, then 30 mg x1 day, then 20 mg x1 day, then 10 mg x1 day, and then 5 mg x1 day and stop  But while doing it she needs to come in for OV with me or an APP 03/23/16 ( do not dopuble book me please)  Thanks  Dr. Brand Males, M.D., [facility].C.P Pulmonary and Critical Care Medicine Staff Physician [place] [facility] Pager: [phone], If no answer or between  15:00h - 7:00h: call [phone]  03/22/2016 2:39 PM       Current Outpatient Prescriptions:  .  albuterol (PROAIR HFA) 108 (90 BASE) MCG/ACT inhaler, Inhale 1-2 puffs into the lungs every 6 (six) hours as needed for wheezing or shortness of breath., Disp: , Rfl:  .  albuterol (PROVENTIL) (2.5 MG/3ML) 0.083% nebulizer solution, Take 2.5 mg by nebulization every 6 (six) hours as needed for wheezing or shortness of breath., Disp: , Rfl:  .  amLODipine (NORVASC) 5 MG tablet, Take 5 mg by mouth daily., Disp: , Rfl:  .  Ascorbic Acid (VITAMIN C) 1000 MG tablet, Take 1,000 mg by mouth daily., Disp: , Rfl:  .  aspirin 81 MG tablet, Take 81 mg by mouth daily., Disp: , Rfl:  .  atorvastatin (LIPITOR) 10 MG tablet, Take 10 mg by mouth every other day. , Disp: , Rfl:  .  azithromycin (ZITHROMAX) 250 MG tablet, Take as directed, Disp: 6 tablet, Rfl: 0 .  budesonide-formoterol (SYMBICORT) 80-4.5 MCG/ACT inhaler, Inhale 2 puffs into the lungs 2 (two) times daily., Disp: 1 Inhaler, Rfl: 12 .  celecoxib (CELEBREX) 200 MG capsule, Take 200 mg by mouth daily., Disp: , Rfl:  .  cholecalciferol (VITAMIN D) 1000 UNITS tablet, Take 1,000 Units by mouth daily., Disp: , Rfl:  .  doxycycline (VIBRA-TABS) 100 MG tablet, Take 1 tablet (100 mg total) by mouth 2 (two) times daily., Disp: 10 tablet, Rfl: 0 .  esomeprazole (NEXIUM) 40 MG capsule, Take 40 mg by mouth daily at 12 noon., Disp: , Rfl:  .  ezetimibe  (ZETIA) 10 MG tablet, Take 10 mg by mouth daily., Disp: , Rfl:  .  furosemide (LASIX) 40 MG tablet, Take 40 mg by mouth., Disp: , Rfl:  .  losartan (COZAAR) 50 MG tablet, Take 50 mg by mouth daily., Disp: , Rfl:  .  Multiple Vitamin (MULTIVITAMIN) capsule, Take 1 capsule by mouth daily., Disp: , Rfl:  .  predniSONE (DELTASONE) 10 MG tablet, Take 40 mg daily x 1 days, 30 mg daily x 1 day,  20 mg daily x 1 day, 10 mg daily x 1 days, 5 mg daily x 1 day then stop, Disp: 11 tablet, Rfl: 0 .  Spacer/Aero-Holding Chambers (AEROCHAMBER Z-STAT PLUS) inhaler, Use as instructed, Disp: 1 each, Rfl: 0 .  Tiotropium Bromide Monohydrate (SPIRIVA RESPIMAT) 1.25 MCG/ACT AERS, Inhale 2 puffs into the lungs daily., Disp: 1 Inhaler, Rfl: 12 .  zinc gluconate 50 MG tablet, Take 50 mg by mouth daily., Disp: , Rfl:

## 2016-03-22 NOTE — Telephone Encounter (Signed)
Spoke with pt, she is not on daily azithromycin.   Pt aware of recs, meds called in to pharmacy.  Pt states that she wishes to take meds and make an appt if this does not improve her symptoms.   Nothing further needed.

## 2016-04-08 IMAGING — NM NM MYOCAR MULTI W/SPECT W/WALL MOTION & EF
2 series · 12 of 12 positions shown · non-contrast
Comparison: none

[Series 1: rest · 8.28mm/px · 6 of 64 frames shown]
[frame 6/64]
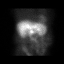
[frame 16/64]
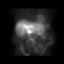
[frame 27/64]
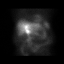
[frame 38/64]
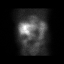
[frame 48/64]
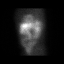
[frame 59/64]
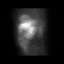

[Series 2: stress gated · 8.28mm/px · 6 of 64 frames shown]
[frame 6/64]
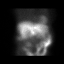
[frame 16/64]
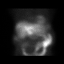
[frame 27/64]
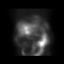
[frame 38/64]
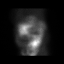
[frame 48/64]
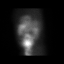
[frame 59/64]
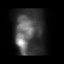

[12 of 12 positions shown; findings below may reference images not displayed]

Canned report from images found in remote index.

Refer to host system for actual result text.

## 2016-05-09 ENCOUNTER — Ambulatory Visit: Payer: Medicare Other | Admitting: Internal Medicine

## 2016-05-31 ENCOUNTER — Ambulatory Visit: Payer: Medicare Other | Admitting: Internal Medicine

## 2016-06-26 ENCOUNTER — Telehealth: Payer: Self-pay | Admitting: Internal Medicine

## 2016-06-26 MED ORDER — BUDESONIDE-FORMOTEROL FUMARATE 80-4.5 MCG/ACT IN AERO: 2.0000 | INHALATION_SPRAY | Freq: Two times a day (BID) | RESPIRATORY_TRACT | 1 refills | Status: DC

## 2016-06-26 NOTE — Telephone Encounter (Signed)
Called and spoke with pts daughter and she is aware of refill that has been sent to the pharmacy.  Nothing further is needed.  

## 2016-07-02 DEATH — deceased

## 2016-07-30 ENCOUNTER — Other Ambulatory Visit (INDEPENDENT_AMBULATORY_CARE_PROVIDER_SITE_OTHER): Payer: Medicare Other

## 2016-07-30 ENCOUNTER — Telehealth: Payer: Self-pay | Admitting: Internal Medicine

## 2016-07-30 ENCOUNTER — Encounter: Payer: Self-pay | Admitting: Internal Medicine

## 2016-07-30 ENCOUNTER — Ambulatory Visit (INDEPENDENT_AMBULATORY_CARE_PROVIDER_SITE_OTHER): Payer: Medicare Other | Admitting: Internal Medicine

## 2016-07-30 VITALS — BP 152/88 | HR 89 | Ht 63.0 in | Wt 150.0 lb

## 2016-07-30 DIAGNOSIS — M2550 Pain in unspecified joint: Secondary | ICD-10-CM

## 2016-07-30 DIAGNOSIS — R918 Other nonspecific abnormal finding of lung field: Secondary | ICD-10-CM

## 2016-07-30 DIAGNOSIS — J449 Chronic obstructive pulmonary disease, unspecified: Secondary | ICD-10-CM

## 2016-07-30 LAB — RHEUMATOID FACTOR: Rhuematoid fact SerPl-aCnc: <14 IU/mL (ref <14)

## 2016-07-30 LAB — SEDIMENTATION RATE: SED RATE: 6 mm/h (ref 0–30)

## 2016-07-30 NOTE — Progress Notes (Signed)
Subjective:     Patient ID: Katie Hunter, female   DOB: 1930-11-28, 83 y.o.   MRN: [id number]  HPI   IOV 03/28/2015  Chief Complaint  Patient presents with  . Pulmonary Consult    Pt is a self referral for COPD. Pt stated she was diagnosed with COPD when she went to an UC a couple months ago. Pt c/o DOE, prod cough with clear and thick mucus. Pt denies CP/tightness.     83 year old female former 50 pack smoker. Presents with her daughter who is a Equities trader. They both live in [place], [place]. Patient reports that prior to December 2015 she was well with doing normal activities of daily living and walking for exercise. She quit smoking many years ago as noted below. Then since December 2015 she is reporting recurrent episodes of pneumonia. Both she and daughter at test at least 5 episodes of pneumonia. His happens every 6 weeks. Each episode is characterized by cough, white sputum, shortness of breath, feeling tired, eating less and sleeping all the time. Generally this no fever except for the most recent episode. Apparently chest x-ray is done each time at the primary care physician's office in [place] [no outside records available for me at this visit] and these have consistently demonstrated pneumonia. She is treated with antibodies and prednisone and gets temporarily better. In between episodes she is fine but then gets sick again. Currently she is on a prednisone course due to end in a couple of days and she is worried that she will have pneumonia again  Recurrent pneumonia associated history -Denies any aspiration. Does have acid reflux for which she is on Nexium and it is well controlled. Denies any autoimmune disease. Denies any dysphagia. Denies any potential hypersensitivity pneumonitis history. She lives in an old house built in [place] but denies any mold, mildew. There is a basement but she does not go there and she denies that it has mold. She does not have any birds in the  house.  Spirometry in the office today - Under the influence of prednisone is normal  Radiologic evaluation  - Not available  04/08/2015 Follow up : Dyspnea  Pt returns follow up . She was seen last month for consult for dyspnea.  She is a former smoker , PFT showed mild COPD with asthma component.  PFT showed FEV1 84%, ratio 81, FVC 76, +BD  CT chest showed mild bronchiectasis , showed small pulmonary nodules in right lung . And mild COPD /emphysema. . We discussed adding inhaler to her regimen .  We discussed repeat CT chest in 6 months .  She recently started coughing up green mucus and was called in doxycycline and prednisone taper  She is starting to feel better.    OV 05/24/2015  Chief Complaint  Patient presents with  . Follow-up    Pt here for 6 week f/u. Pt states her breathing is doing well. Pt states she has a prod cough with yellow mucus in morning then clear mucus throughout the day. Pt denies CP/tightness.    Follow-up mild-moderate COPD with mild bronchiectasis in this previous smoker. Positive bronchodilator response Follow-up lung nodules  She presents with her daughter Katie Hunter. They both live in [place]. Reports that after seeing nurse practitioner and getting started on triple inhaler therapy she is much better. She's really thankful. Currently doing well without much symptoms. Her other daughter has been diagnosed with colon cancer stage III and she struggling with this. In addition for  the last day or 2 she's having some sinus congestion associated with and or drainage but there are no symptoms of COPD exacerbation. She is up-to-date with her vaccines. Wants handicapped stiocker   OV 07/30/2016  Chief Complaint  Patient presents with  . Follow-up    Pt states her breathing is doing well. Pt states her cough is better. Pt denies SOB and CP/tightness.     Follow-up mild to moderate COPD with mild bronchiectasis and this previous smoker with possible patellar  response Follow-up lung nodules April 2017  She presents with her daughter was not nurse practitioner he did patient is doing well with triple inhaler therapy terms of his COPD. In terms of a lung nodule she is due for her CT chest in April 2018 to be 1 year and August 2018 which would be one half years. In the interim her husband deceased in nov  Sep 12, 2015 after suffering from sigmoid for volvulus. There are no interim exacerbations  A new complaint is that of nonspecific arthralgia for many years. She is requesting daily prednisone. Her daughter suspects she might have polymyalgia rheumatica. We discussed side effects of prednisone but she does want some initial testing for this arthralgia. It involves the knee and shoulder joints particularly in the hip joints is. Present for many years and stable mild to moderate in intensity made worse by exertion    CAT COPD Symptom & Quality of Life Score (GSK trademark) 0 is no burden. 5 is highest burden 05/24/2015   Never Cough -> Cough all the time 2  No phlegm in chest -> Chest is full of phlegm 3  No chest tightness -> Chest feels very tight 0  No dyspnea for 1 flight stairs/hill -> Very dyspneic for 1 flight of stairs 2  No limitations for ADL at home -> Very limited with ADL at home 2  Confident leaving home -> Not at all confident leaving home 2  Sleep soundly -> Do not sleep soundly because of lung condition 1  Lots of Energy -> No energy at all 0  TOTAL Score (max 40)  12        has a past medical history of Asthma; COPD (chronic obstructive pulmonary disease) ([place]); Coronary atherosclerosis; Essential hypertension; Hypercholesteremia; Melanoma ([place]); and Seasonal allergies.   reports that she quit smoking about 22 years ago. Her smoking use included Cigarettes. She has a 50.00 pack-year smoking history. She has never used smokeless tobacco.  Past Surgical History:  Procedure Laterality Date  . ABDOMINAL HYSTERECTOMY    . CHOLECYSTECTOMY     . EYE SURGERY     BIL x 6   . Left toe amputation    . MELANOMA EXCISION    . OTHER SURGICAL HISTORY     Eye lid lift  . Skin cancer removal     Left upper eye lid    Allergies  Allergen Reactions  . Penicillins     Itching     Immunization History  Administered Date(s) Administered  . Influenza,inj,Quad PF,36+ Mos 03/28/2015, 04/01/2016  . Pneumococcal-Unspecified 07/02/2013    Family History  Problem Relation Age of Onset  . Emphysema Father   . Heart disease Mother      Current Outpatient Prescriptions:  .  albuterol (PROAIR HFA) 108 (90 BASE) MCG/ACT inhaler, Inhale 1-2 puffs into the lungs every 6 (six) hours as needed for wheezing or shortness of breath., Disp: , Rfl:  .  Ascorbic Acid (VITAMIN C) 1000 MG tablet, Take  1,000 mg by mouth daily., Disp: , Rfl:  .  aspirin 81 MG tablet, Take 81 mg by mouth daily., Disp: , Rfl:  .  atorvastatin (LIPITOR) 10 MG tablet, Take 10 mg by mouth every other day. , Disp: , Rfl:  .  budesonide-formoterol (SYMBICORT) 80-4.5 MCG/ACT inhaler, Inhale 2 puffs into the lungs 2 (two) times daily., Disp: 1 Inhaler, Rfl: 1 .  celecoxib (CELEBREX) 200 MG capsule, Take 200 mg by mouth daily., Disp: , Rfl:  .  cholecalciferol (VITAMIN D) 1000 UNITS tablet, Take 1,000 Units by mouth daily., Disp: , Rfl:  .  esomeprazole (NEXIUM) 40 MG capsule, Take 40 mg by mouth daily at 12 noon., Disp: , Rfl:  .  ezetimibe (ZETIA) 10 MG tablet, Take 10 mg by mouth daily., Disp: , Rfl:  .  furosemide (LASIX) 40 MG tablet, Take 40 mg by mouth., Disp: , Rfl:  .  losartan (COZAAR) 50 MG tablet, Take 50 mg by mouth daily., Disp: , Rfl:  .  Spacer/Aero-Holding Chambers (AEROCHAMBER Z-STAT PLUS) inhaler, Use as instructed, Disp: 1 each, Rfl: 0 .  Tiotropium Bromide Monohydrate (SPIRIVA RESPIMAT) 1.25 MCG/ACT AERS, Inhale 2 puffs into the lungs daily., Disp: 1 Inhaler, Rfl: 12 .  zinc gluconate 50 MG tablet, Take 50 mg by mouth daily., Disp: , Rfl:  .  albuterol  (PROVENTIL) (2.5 MG/3ML) 0.083% nebulizer solution, Take 2.5 mg by nebulization every 6 (six) hours as needed for wheezing or shortness of breath., Disp: , Rfl:  .  amLODipine (NORVASC) 5 MG tablet, Take 5 mg by mouth daily., Disp: , Rfl:    Review of Systems     Objective:   Physical Exam  Constitutional: She is oriented to person, place, and time. She appears well-developed and well-nourished. No distress.  HENT:  Head: Normocephalic and atraumatic.  Right Ear: External ear normal.  Left Ear: External ear normal.  Mouth/Throat: Oropharynx is clear and moist. No oropharyngeal exudate.  Eyes: Conjunctivae and EOM are normal. Pupils are equal, round, and reactive to light. Right eye exhibits no discharge. Left eye exhibits no discharge. No scleral icterus.  Neck: Normal range of motion. Neck supple. No JVD present. No tracheal deviation present. No thyromegaly present.  Cardiovascular: Normal rate, regular rhythm, normal heart sounds and intact distal pulses.  Exam reveals no gallop and no friction rub.   No murmur heard. Pulmonary/Chest: Effort normal and breath sounds normal. No respiratory distress. She has no wheezes. She has no rales. She exhibits no tenderness.  Abdominal: Soft. Bowel sounds are normal. She exhibits no distension and no mass. There is no tenderness. There is no rebound and no guarding.  Musculoskeletal: Normal range of motion. She exhibits no edema or tenderness.  Antalgic gait  Lymphadenopathy:    She has no cervical adenopathy.  Neurological: She is alert and oriented to person, place, and time. She has normal reflexes. No cranial nerve deficit. She exhibits normal muscle tone. Coordination normal.  Skin: Skin is warm and dry. No rash noted. She is not diaphoretic. No erythema. No pallor.  Psychiatric: She has a normal mood and affect. Her behavior is normal. Judgment and thought content normal.  Vitals reviewed.   Vitals:   07/30/16 1118  BP: (!) 152/88   Pulse: 89  SpO2: 96%  Weight: 150 lb (68 kg)  Height: 5' 3"  (1.6 m)    Estimated body mass index is 26.57 kg/m as calculated from the following:   Height as of this encounter: 5' 3"  (  1.6 m).   Weight as of this encounter: 150 lb (68 kg).      Assessment:     ICD-9-CM ICD-10-CM   1. Chronic obstructive pulmonary disease, unspecified COPD type (HCC) 496 J44.9 ANA     Rheumatoid Factor     Cyclic citrul peptide antibody, IgG     Sedimentation rate  2. Arthralgia, unspecified joint 719.40 M25.50 ANA     Rheumatoid Factor     Cyclic citrul peptide antibody, IgG     Sedimentation rate  3. Multiple lung nodules 793.19 R91.8 CT Chest Wo Contrast       Plan:      #COPD - stable disease - refill and take sample of spiriva and symbicort - use albuterol as needed - Glad you're up-to-date with the flu shot - Respect your refusal of pulmonary rehabilitation  #arthralgia  - check ana, rheumatoid factor, ccp and esr blood test - will call with results  #Lung nodule - Do one year follow-up  In 6 months  #Followup - 6 months or sooner if needed   Dr. Brand Males, M.D., [facility].C.P Pulmonary and Critical Care Medicine Staff Physician [place] [facility] Pager: [phone], If no answer or between  15:00h - 7:00h: call [phone]  07/30/2016 11:35 AM

## 2016-07-30 NOTE — Patient Instructions (Addendum)
ICD-9-CM ICD-10-CM   1. Chronic obstructive pulmonary disease, unspecified COPD type (HCC) 496 J44.9 ANA     Rheumatoid Factor     Cyclic citrul peptide antibody, IgG     Sedimentation rate  2. Arthralgia, unspecified joint 719.40 M25.50 ANA     Rheumatoid Factor     Cyclic citrul peptide antibody, IgG     Sedimentation rate  3. Multiple lung nodules 793.19 R91.8 CT Chest Wo Contrast    #COPD - stable disease - refill and take sample of spiriva and symbicort - use albuterol as needed - Glad you're up-to-date with the flu shot - Respect your refusal of pulmonary rehabilitation  #arthralgia - new problem  =- check ana, rheumatoid factor, ccp and esr blood test - will call with results  #Lung nodules  - 6 months do CT chest  #Followup - 6- months after ct chest  or sooner if needed

## 2016-07-30 NOTE — Telephone Encounter (Signed)
Pt's daughter returning a phone call to schedule a CT chest.  Pt prefers this to be performed at [facility]. PCC's please call pt's daughter at below number to schedule CT.  Thanks!

## 2016-07-31 LAB — ANA: ANA: NEGATIVE

## 2016-07-31 LAB — CYCLIC CITRUL PEPTIDE ANTIBODY, IGG: Cyclic Citrullin Peptide Ab: <16 Units

## 2016-07-31 NOTE — Telephone Encounter (Signed)
I actually spoke to [facility] verified CT is not needed until 6 months from now (July).  Called pt's dtr back to make her aware.  Had to leave vm for her to call me back.  I made note on order that CT is wanted at [facility].

## 2016-08-01 ENCOUNTER — Telehealth: Payer: Self-pay | Admitting: Internal Medicine

## 2016-08-01 NOTE — Telephone Encounter (Signed)
Esr and autoimmune normal. So no polymyalgia rheumatica  Dr. Brand Males, M.D., [facility].C.P Pulmonary and Critical Care Medicine Staff Physician [place] [facility] Pager: [phone], If no answer or between  15:00h - 7:00h: call [phone]  08/01/2016 2:26 PM

## 2016-08-02 NOTE — Telephone Encounter (Signed)
Spoke with pt's daughter, [name]. She is aware that pt doesn't need a CT until July. [name] would prefer to go ahead and schedule this if possible. Will route message to [facility] to make them aware.

## 2016-08-02 NOTE — Telephone Encounter (Signed)
I scheduled CT for 7/27 at 11:00 at [facility].  I spoke to pt's dtr Robin & gave her appt info.  Nothing further needed.

## 2016-08-06 ENCOUNTER — Telehealth: Payer: Self-pay | Admitting: Internal Medicine

## 2016-08-06 DIAGNOSIS — J449 Chronic obstructive pulmonary disease, unspecified: Secondary | ICD-10-CM

## 2016-08-06 MED ORDER — ALBUTEROL SULFATE (2.5 MG/3ML) 0.083% IN NEBU: 2.5000 mg | INHALATION_SOLUTION | Freq: Four times a day (QID) | RESPIRATORY_TRACT | 5 refills | Status: DC

## 2016-08-06 MED ORDER — TRAMADOL HCL 50 MG PO TABS: 50.0000 mg | ORAL_TABLET | Freq: Three times a day (TID) | ORAL | 0 refills | Status: DC | PRN

## 2016-08-06 NOTE — Telephone Encounter (Signed)
Called and spoke to pt. Informed her of the results per MR. Pt verbalized understanding and denied any further questions or concerns at this time.  

## 2016-08-06 NOTE — Telephone Encounter (Signed)
Alb neb refil - ok for this  Tramadol for leg pain - 50mg  tid prn x 7 days . ZEro refills. All future questions on leg pain with pcp Brigid Re, FNP.   Dr. Brand Males, M.D., [facility].C.P Pulmonary and Critical Care Medicine Staff Physician [place] [facility] Pager: [phone], If no answer or between  15:00h - 7:00h: call [phone]  08/06/2016 4:37 PM

## 2016-08-06 NOTE — Telephone Encounter (Signed)
Spoke with pt's daughter, [name]. States that pt is still having leg pain. [name] is wanting to know if MR will prescribe something like Tramadol for this. Pt also needs a refill on Albuterol neb solution and an order sent to her DME for neb tubing kits. These 2 have been taken care of.  MR - please advise on meds for leg pain. Thanks.

## 2016-08-06 NOTE — Telephone Encounter (Signed)
Spoke with the pt's daughter and notified of recs per MR  I have called in rx  Nothing further needed

## 2016-08-15 ENCOUNTER — Telehealth: Payer: Self-pay | Admitting: Internal Medicine

## 2016-08-15 MED ORDER — TIOTROPIUM BROMIDE MONOHYDRATE 1.25 MCG/ACT IN AERS: 2.0000 | INHALATION_SPRAY | Freq: Every day | RESPIRATORY_TRACT | 5 refills | Status: DC

## 2016-08-15 MED ORDER — BUDESONIDE-FORMOTEROL FUMARATE 80-4.5 MCG/ACT IN AERO: 2.0000 | INHALATION_SPRAY | Freq: Two times a day (BID) | RESPIRATORY_TRACT | 5 refills | Status: DC

## 2016-08-15 NOTE — Telephone Encounter (Signed)
Spoke with pt's daughter, [name]. Pt is needing refills on Symbicort and Spiriva. These have been sent in. Nothing further was needed.

## 2016-09-20 ENCOUNTER — Telehealth: Payer: Self-pay | Admitting: Internal Medicine

## 2016-09-20 NOTE — Telephone Encounter (Signed)
Katie Hunter I have not been able to see anything in the chart to where the pt assistance forms were being filled out.  Please advise if you have seen any of these forms. thanks

## 2016-09-21 NOTE — Telephone Encounter (Signed)
Called and spoke to pt's daughter. Informed her these were faxed out early March 2018. She verbalized understanding and states she will the manufacturer for updates.

## 2016-09-24 ENCOUNTER — Telehealth: Payer: Self-pay | Admitting: Internal Medicine

## 2016-09-24 NOTE — Telephone Encounter (Signed)
Spoke with [name] (pt's daughter, dpr on file), states that both AZ&Me and BI Cares applications for symbicort and spiriva were not filled out correctly- provider info was missing on both forms.  [name] is wanting to know if we still have copies of these forms that can be filled out completely and refaxed, or if we need her to re-fill out forms.  I attempted to find forms in MR's cubby but could not locate them- Daneil Dan please advise if you have these forms still.  Thanks.

## 2016-09-27 NOTE — Telephone Encounter (Signed)
Called and spoke to pt's daughter. Only BI form for spiriva was needing to be corrected. Informed her that I have completed the needed form (form is in Media tab) and faxed back to [facility]. She verbalized understanding and denied any further questions or concerns at this time.

## 2016-10-08 ENCOUNTER — Telehealth: Payer: Self-pay | Admitting: Internal Medicine

## 2016-10-08 MED ORDER — TIOTROPIUM BROMIDE MONOHYDRATE 1.25 MCG/ACT IN AERS: 2.0000 | INHALATION_SPRAY | Freq: Every day | RESPIRATORY_TRACT | 3 refills | Status: DC

## 2016-10-08 NOTE — Telephone Encounter (Signed)
Spoke with daughter who was upset that rx was not sent in with assistant forms. Spoke with Daneil Dan and she states she did fax in the forms and the rx unsure of what happened. Re-printed rx and paper work that was scanned faxed it to [phone].. Informed daughter this was faxed. Received confirmation that fax was successful. Will forward to [place] to follow up on.

## 2016-10-11 NOTE — Telephone Encounter (Signed)
Daneil Dan please advise of any updates on this message. thanks

## 2016-10-12 NOTE — Telephone Encounter (Signed)
I will not receive any updates, the pt will receive updates. It may take several weeks to process the rx. Pt aware to contact our office back if there are any other issues.

## 2017-01-04 ENCOUNTER — Telehealth: Payer: Self-pay | Admitting: Internal Medicine

## 2017-01-04 MED ORDER — AZITHROMYCIN 250 MG PO TABS: ORAL_TABLET | ORAL | 0 refills | Status: DC

## 2017-01-04 MED ORDER — PREDNISONE 10 MG PO TABS: ORAL_TABLET | ORAL | 0 refills | Status: DC

## 2017-01-04 NOTE — Telephone Encounter (Signed)
zpak Prednisone 10 mg take  4 each am x 2 days,   2 each am x 2 days,  1 each am x 2 days and stop  Increase nexium until better = Take 30- 60 min before your first and last meals of the day

## 2017-01-04 NOTE — Telephone Encounter (Signed)
Spoke with patient. She is aware of MW's recs. Zpak and Prednisone have been called into [facility] in [place]. She verbalized understanding. Nothing further needed at time of call.

## 2017-01-04 NOTE — Telephone Encounter (Signed)
Spoke with pharmacist about Zpak. Nothing further needed at time of call.

## 2017-01-04 NOTE — Telephone Encounter (Signed)
Spoke with patient. She stated that she has had a sore, scratchy throat for the past few days. She also noticed a lump under her chin that looks like a goiter. She has a productive cough with yellow phlegm. Denies any fevers or SOB. Also denied any difficulty swallowing or feeling like her throat is closing. No increased wheezing.   She would like to use [facility] in [place].   MW, please advise since MR is not in the clinic today. Thanks!

## 2017-01-25 ENCOUNTER — Ambulatory Visit (HOSPITAL_COMMUNITY)
Admission: RE | Admit: 2017-01-25 | Discharge: 2017-01-25 | Disposition: A | Discharge status: Home / Self Care | Payer: Medicare Other | Source: Ambulatory Visit | Attending: Internal Medicine | Admitting: Internal Medicine

## 2017-01-25 DIAGNOSIS — I517 Cardiomegaly: Secondary | ICD-10-CM | POA: Diagnosis not present

## 2017-01-25 DIAGNOSIS — I700 Atherosclerosis of aorta: Secondary | ICD-10-CM | POA: Insufficient documentation

## 2017-01-25 DIAGNOSIS — Q2546 Tortuous aortic arch: Secondary | ICD-10-CM | POA: Diagnosis not present

## 2017-01-25 DIAGNOSIS — M47814 Spondylosis without myelopathy or radiculopathy, thoracic region: Secondary | ICD-10-CM | POA: Insufficient documentation

## 2017-01-25 DIAGNOSIS — I2510 Atherosclerotic heart disease of native coronary artery without angina pectoris: Secondary | ICD-10-CM | POA: Insufficient documentation

## 2017-01-25 DIAGNOSIS — K449 Diaphragmatic hernia without obstruction or gangrene: Secondary | ICD-10-CM | POA: Diagnosis not present

## 2017-01-25 DIAGNOSIS — Z87891 Personal history of nicotine dependence: Secondary | ICD-10-CM | POA: Insufficient documentation

## 2017-01-25 DIAGNOSIS — R918 Other nonspecific abnormal finding of lung field: Secondary | ICD-10-CM | POA: Diagnosis not present

## 2017-01-25 DIAGNOSIS — J432 Centrilobular emphysema: Secondary | ICD-10-CM | POA: Diagnosis not present

## 2017-01-25 NOTE — Progress Notes (Signed)
Called and spoke to pt. Informed her of the results per MR. Pt verbalized understanding and denied any further questions or concerns at this time.  

## 2017-03-22 ENCOUNTER — Telehealth: Payer: Self-pay | Admitting: Internal Medicine

## 2017-03-22 MED ORDER — PREDNISONE 10 MG PO TABS: ORAL_TABLET | ORAL | 0 refills | Status: DC

## 2017-03-22 MED ORDER — AZITHROMYCIN 250 MG PO TABS: ORAL_TABLET | ORAL | 0 refills | Status: AC

## 2017-03-22 NOTE — Telephone Encounter (Signed)
Pt's daughter returning call about her mother.  [phone]

## 2017-03-22 NOTE — Telephone Encounter (Signed)
Pt's daughter, [name] is aware of MW's recommendations and voiced her understanding. Rx has been sent to preferred pharmacy. Nothing further needed.

## 2017-03-22 NOTE — Telephone Encounter (Signed)
zpak Prednisone 10 mg take  4 each am x 2 days,   2 each am x 2 days,  1 each am x 2 days and stop  

## 2017-03-22 NOTE — Telephone Encounter (Signed)
lmtcb x1 for pt's daughter, [name].

## 2017-03-22 NOTE — Telephone Encounter (Signed)
Spoke with pt's daughter, Katie Hunter. Katie Hunter states pt has developed prod cough with green mucus, wheezing & chest congestion x2d. Katie Hunter is requesting Rx for prednisone and Zpak, as this has helped with sx previously.  MW please advise, as MR is unavailable. Thanks.     AVS 07/30/16 1. Chronic obstructive pulmonary disease, unspecified COPD type (HCC) 496 J44.9 ANA     Rheumatoid Factor     Cyclic citrul peptide antibody, IgG     Sedimentation rate  2. Arthralgia, unspecified joint 719.40 M25.50 ANA     Rheumatoid Factor     Cyclic citrul peptide antibody, IgG     Sedimentation rate  3. Multiple lung nodules 793.19 R91.8 CT Chest Wo Contrast    #COPD - stable disease - refill and take sample of spiriva and symbicort - use albuterol as needed - Glad you're up-to-date with the flu shot - Respect your refusal of pulmonary rehabilitation  #arthralgia - new problem  =- check ana, rheumatoid factor, ccp and esr blood test - will call with results  #Lung nodules  - 6 months do CT chest  #Followup - 6- months after ct chest  or sooner if needed

## 2017-04-17 ENCOUNTER — Telehealth: Payer: Self-pay | Admitting: Internal Medicine

## 2017-04-17 MED ORDER — AZITHROMYCIN 250 MG PO TABS: ORAL_TABLET | ORAL | 0 refills | Status: DC

## 2017-04-17 MED ORDER — PREDNISONE 10 MG PO TABS: ORAL_TABLET | ORAL | 0 refills | Status: DC

## 2017-04-17 NOTE — Telephone Encounter (Signed)
Spoke with patient's daughter. She stated that they will try the zpak and prednisone for now. They wish to use [facility] in [place].   Verbalized understanding. Nothing else needed at time of call.

## 2017-04-17 NOTE — Telephone Encounter (Signed)
We haven't seen her in over 6 months so strongly prefer ov If refuses can do one more zpak/ Prednisone 10 mg take  4 each am x 2 days,   2 each am x 2 days,  1 each am x 2 days and stop but nothing else s ov to at least f/u this rec

## 2017-04-17 NOTE — Telephone Encounter (Signed)
Called and spoke with pts daughter and she stated that the pt started yesterday with a cough with thick yellow sputum. She denies any fever, chills or sweats.  zpak and pred taper given back on 9/21 and pt did complete this.  pts daughter is requesting that something be called in for her.  MR is out of the office.  MW please advise. Thanks  Allergies  Allergen Reactions  . Penicillins     Itching

## 2017-05-13 ENCOUNTER — Encounter: Payer: Self-pay | Admitting: Adult Health

## 2017-05-13 ENCOUNTER — Ambulatory Visit (INDEPENDENT_AMBULATORY_CARE_PROVIDER_SITE_OTHER)
Admission: RE | Admit: 2017-05-13 | Discharge: 2017-05-13 | Disposition: A | Discharge status: Home / Self Care | Payer: Medicare Other | Source: Ambulatory Visit | Attending: Adult Health | Admitting: Adult Health

## 2017-05-13 ENCOUNTER — Ambulatory Visit: Payer: Medicare Other | Admitting: Adult Health

## 2017-05-13 VITALS — BP 132/78 | HR 97 | Ht 63.0 in | Wt 149.4 lb

## 2017-05-13 DIAGNOSIS — J449 Chronic obstructive pulmonary disease, unspecified: Secondary | ICD-10-CM

## 2017-05-13 DIAGNOSIS — J310 Chronic rhinitis: Secondary | ICD-10-CM

## 2017-05-13 NOTE — Patient Instructions (Addendum)
Begin Mucinex DM Twice daily  As needed  Congestion  Begin Saline nasal rinses Twice daily  .  Begin Zyrtec 10mg  At bedtime  .  Continue on Flonase 2 puffs daily .  Continue on Symbicort and Spiriva .  Follow up in 3 months with Dr. Chase Caller and As needed   Please contact office for sooner follow up if symptoms do not improve or worsen or seek emergency care

## 2017-05-13 NOTE — Assessment & Plan Note (Signed)
Flare slowly resolved  CXR w/ no acute process  tx triggers with AR control   Plan  Patient Instructions  Begin Mucinex DM Twice daily  As needed  Congestion  Begin Saline nasal rinses Twice daily  .  Begin Zyrtec 10mg  At bedtime  .  Continue on Flonase 2 puffs daily .  Continue on Symbicort and Spiriva .  Follow up in 3 months with Dr. Chase Caller and As needed   Please contact office for sooner follow up if symptoms do not improve or worsen or seek emergency care

## 2017-05-13 NOTE — Progress Notes (Signed)
@Patient  ID: Katie Hunter, female    DOB: 1930-10-21, 83 y.o.   MRN: [id number]  Chief Complaint  Patient presents with  . Acute Visit    COPD     Referring provider: Brigid Re, FNP  HPI: 83 yo female former smoker followed for COPD , Lung nodules and Mild Bronchiectasis   TEST  PFT 2016  showed FEV1 84%, ratio 81, FVC 76, +BD  03/2015 CT chest showed mild bronchiectasis , showed small pulmonary nodules in right lung  12/2016 CT chest bilateral pulmonary nodules c/w benign etiology  07/2016 neg ANA , ESR , CCP , ANA   05/13/2017 Acute OV  : COPD  Pt presents for an acute office visit. Complains of 4 weeks of cough, congestion sinus drainage,. She has been seen by PCP and given 2 abx and steroid taper with some improvement but has not totally resolved.  Still has cough and congestion with yellow /white mucus  Worst is sinus congestion and stuffiness. Has a lot of dryness in mouth. No wheezing . No chest pain ,orthoponea, edema or hemoptysis .  Remains on Spiriva and Symbicort .  CXR today with no acute process.   Allergies  Allergen Reactions  . Penicillins     Itching     Immunization History  Administered Date(s) Administered  . Influenza, High Dose Seasonal PF 03/31/2017  . Influenza,inj,Quad PF,6+ Mos 03/28/2015, 04/01/2016  . Pneumococcal-Unspecified 07/02/2013    Past Medical History:  Diagnosis Date  . Asthma   . COPD (chronic obstructive pulmonary disease) ([place])   . Coronary atherosclerosis    Documented by chest CT imaging October 2016  . Essential hypertension   . Hypercholesteremia   . Melanoma ([place])    Left toe - amputated  . Seasonal allergies     Tobacco History: Social History   Tobacco Use  Smoking Status Former Smoker  . Packs/day: 1.25  . Years: 40.00  . Pack years: 50.00  . Types: Cigarettes  . Last attempt to quit: 07/02/1994  . Years since quitting: 22.8  Smokeless Tobacco Never Used   Counseling given: Not Answered   Outpatient  Encounter Medications as of 05/13/2017  Medication Sig  . albuterol (PROAIR HFA) 108 (90 BASE) MCG/ACT inhaler Inhale 1-2 puffs into the lungs every 6 (six) hours as needed for wheezing or shortness of breath.  Marland Kitchen albuterol (PROVENTIL) (2.5 MG/3ML) 0.083% nebulizer solution Take 3 mLs (2.5 mg total) by nebulization every 6 (six) hours. And as needed  . amLODipine (NORVASC) 5 MG tablet Take 5 mg by mouth daily.  . Ascorbic Acid (VITAMIN C) 1000 MG tablet Take 1,000 mg by mouth daily.  Marland Kitchen aspirin 81 MG tablet Take 81 mg by mouth daily.  Marland Kitchen atorvastatin (LIPITOR) 10 MG tablet Take 10 mg by mouth every other day.   . budesonide-formoterol (SYMBICORT) 80-4.5 MCG/ACT inhaler Inhale 2 puffs into the lungs 2 (two) times daily.  . celecoxib (CELEBREX) 200 MG capsule Take 200 mg by mouth daily.  . cholecalciferol (VITAMIN D) 1000 UNITS tablet Take 1,000 Units by mouth daily.  Marland Kitchen esomeprazole (NEXIUM) 40 MG capsule Take 40 mg by mouth daily at 12 noon.  . ezetimibe (ZETIA) 10 MG tablet Take 10 mg by mouth daily.  . furosemide (LASIX) 40 MG tablet Take 40 mg by mouth.  . losartan (COZAAR) 50 MG tablet Take 50 mg by mouth daily.  Marland Kitchen Spacer/Aero-Holding Chambers (AEROCHAMBER Z-STAT PLUS) inhaler Use as instructed  . Tiotropium Bromide Monohydrate (SPIRIVA RESPIMAT)  1.25 MCG/ACT AERS Inhale 2 puffs into the lungs daily.  . traMADol (ULTRAM) 50 MG tablet Take 1 tablet (50 mg total) by mouth 3 (three) times daily as needed.  . zinc gluconate 50 MG tablet Take 50 mg by mouth daily.  . [DISCONTINUED] azithromycin (ZITHROMAX) 250 MG tablet Take 2 tablets on first day. Then 1 tablet daily until finished. (Patient not taking: Reported on 05/13/2017)  . [DISCONTINUED] predniSONE (DELTASONE) 10 MG tablet 4 tabs each am x 2 days, 2 tabs x 2 days, 1 tab x 2 days then stop (Patient not taking: Reported on 05/13/2017)  . [DISCONTINUED] predniSONE (DELTASONE) 10 MG tablet Take 4 tabs for 2 days, then 3 tabs for 2 days, 2 tabs  for 2 days, then 1 tab for 2 days, then stop. (Patient not taking: Reported on 05/13/2017)   No facility-administered encounter medications on file as of 05/13/2017.      Review of Systems  Constitutional:   No  weight loss, night sweats,  Fevers, chills, fatigue, or  lassitude.  HEENT:   No headaches,  Difficulty swallowing,  Tooth/dental problems, or  Sore throat,                No sneezing, itching, ear ache, nasal congestion, post nasal drip,   CV:  No chest pain,  Orthopnea, PND, swelling in lower extremities, anasarca, dizziness, palpitations, syncope.   GI  No heartburn, indigestion, abdominal pain, nausea, vomiting, diarrhea, change in bowel habits, loss of appetite, bloody stools.   Resp: No shortness of breath with exertion or at rest.  No excess mucus, no productive cough,  No non-productive cough,  No coughing up of blood.  No change in color of mucus.  No wheezing.  No chest wall deformity  Skin: no rash or lesions.  GU: no dysuria, change in color of urine, no urgency or frequency.  No flank pain, no hematuria   MS:  No joint pain or swelling.  No decreased range of motion.  No back pain.    Physical Exam  BP 132/78 (BP Location: Right Arm, Cuff Size: Normal)   Pulse 97   Ht 5' 3"  (1.6 m)   Wt 149 lb 6.4 oz (67.8 kg)   SpO2 96%   BMI 26.47 kg/m   GEN: A/Ox3; pleasant , NAD, elderly    HEENT:  [place]/AT,  EACs-clear, TMs-wnl, NOSE-clear drainage , THROAT-clear, no lesions, no postnasal drip or exudate noted.   NECK:  Supple w/ fair ROM; no JVD; normal carotid impulses w/o bruits; no thyromegaly or nodules palpated; no lymphadenopathy.    RESP  Clear  P & A; w/o, wheezes/ rales/ or rhonchi. no accessory muscle use, no dullness to percussion  CARD:  RRR, no m/r/g, no peripheral edema, pulses intact, no cyanosis or clubbing.  GI:   Soft & nt; nml bowel sounds; no organomegaly or masses detected.   Musco: Warm bil, no deformities or joint swelling noted.    Neuro: alert, no focal deficits noted.    Skin: Warm, no lesions or rashes    Lab Results:  CBC No results found for: WBC, RBC, HGB, HCT, PLT, MCV, MCH, MCHC, RDW, LYMPHSABS, MONOABS, EOSABS, BASOSABS  BMET No results found for: NA, K, CL, CO2, GLUCOSE, BUN, CREATININE, CALCIUM, GFRNONAA, GFRAA  BNP No results found for: BNP  ProBNP No results found for: PROBNP  Imaging: Dg Chest 2 View  Result Date: 05/13/2017 CLINICAL DATA:  Cough and congestion. EXAM: CHEST  2 VIEW COMPARISON:  None. FINDINGS: The heart is enlarged. There is calcified tortuous aorta. There is no consolidation or edema. Degenerative scoliosis convex RIGHT. IMPRESSION: No active disease.  Cardiomegaly. Electronically Signed   By: Staci Righter M.D.   On: 05/13/2017 14:52     Assessment & Plan:   Chronic obstructive pulmonary disease ([place]) Flare slowly resolved  CXR w/ no acute process  tx triggers with AR control   Plan  Patient Instructions  Begin Mucinex DM Twice daily  As needed  Congestion  Begin Saline nasal rinses Twice daily  .  Begin Zyrtec 39m At bedtime  .  Continue on Flonase 2 puffs daily .  Continue on Symbicort and Spiriva .  Follow up in 3 months with Dr. RChase Callerand As needed   Please contact office for sooner follow up if symptoms do not improve or worsen or seek emergency care        Chronic rhinitis Flare   Plan  Patient Instructions  Begin Mucinex DM Twice daily  As needed  Congestion  Begin Saline nasal rinses Twice daily  .  Begin Zyrtec 166mAt bedtime  .  Continue on Flonase 2 puffs daily .  Continue on Symbicort and Spiriva .  Follow up in 3 months with Dr. RaChase Callernd As needed   Please contact office for sooner follow up if symptoms do not improve or worsen or seek emergency care           TaRexene EdisonNP 05/13/2017

## 2017-05-13 NOTE — Assessment & Plan Note (Signed)
Flare   Plan  Patient Instructions  Begin Mucinex DM Twice daily  As needed  Congestion  Begin Saline nasal rinses Twice daily  .  Begin Zyrtec 10mg  At bedtime  .  Continue on Flonase 2 puffs daily .  Continue on Symbicort and Spiriva .  Follow up in 3 months with Dr. Chase Caller and As needed   Please contact office for sooner follow up if symptoms do not improve or worsen or seek emergency care

## 2017-05-20 ENCOUNTER — Telehealth: Payer: Self-pay | Admitting: Internal Medicine

## 2017-05-20 DIAGNOSIS — J449 Chronic obstructive pulmonary disease, unspecified: Secondary | ICD-10-CM

## 2017-05-20 NOTE — Telephone Encounter (Signed)
She has just had 2 abx and steroids courses, if she is still sick will need to leave sputum cx .  CXR is clear .  Make sure she is taking zyrtec, mucinex and delsym as well.  Please contact office for sooner follow up if symptoms do not improve or worsen or seek emergency care

## 2017-05-20 NOTE — Telephone Encounter (Signed)
lmtcb X1 for pt's daughter [name]. Will order sputum culture after speaking to pt's daughter.

## 2017-05-20 NOTE — Telephone Encounter (Signed)
Called and spoke with pts daughter and she stated that the pt is c/o of coughing more at night and is unable to rest good.  She stated that she did start the zyrtec and mucinex d and she is coughing with some yellow sputum at times.  Daughter wanted to see if TP would be willing to give her abx ( other than zpak---and she is PCN allergic) and pred taper to see if this will clear this up.  TP please advise.  Pt was seen by you last week.    Allergies  Allergen Reactions  . Penicillins     Itching

## 2017-05-21 NOTE — Telephone Encounter (Addendum)
Called pt and spoke with her daughter advised message from the provider. Pt understood and verbalized understanding. Nothing further is needed.   She wanted to have the sputum culture collected there in [place] since it is closer. I faxed an order to [phone] and advised daughter to drop off the culture to the lab in [place], [place]. Fax confirmation received.

## 2017-05-21 NOTE — Telephone Encounter (Signed)
Robin returned call, CB is [phone].

## 2017-06-05 ENCOUNTER — Other Ambulatory Visit: Payer: Self-pay | Admitting: Internal Medicine

## 2017-07-09 ENCOUNTER — Telehealth: Payer: Self-pay | Admitting: Internal Medicine

## 2017-07-09 NOTE — Telephone Encounter (Signed)
PA initiated through covermymeds. PA key is [id number]

## 2017-07-10 NOTE — Telephone Encounter (Signed)
Called [facility] and spoke with Waunita Schooner  He states that they figured out a way to get her albuterol approved for only 3 $  She has already picked up med

## 2017-08-16 ENCOUNTER — Other Ambulatory Visit: Payer: Self-pay | Admitting: Internal Medicine

## 2017-08-21 ENCOUNTER — Telehealth: Payer: Self-pay | Admitting: Internal Medicine

## 2017-08-21 NOTE — Telephone Encounter (Signed)
Have received patient assistance forms regarding pt's symbicort and spiriva.  Will have MR sign both of these for pt once he returns back to the office March 5.  Called pt and left her a message making aware of this.  Nothing further needed at this current time.

## 2017-08-23 ENCOUNTER — Other Ambulatory Visit: Payer: Self-pay | Admitting: *Deleted

## 2017-08-23 MED ORDER — ALBUTEROL SULFATE (2.5 MG/3ML) 0.083% IN NEBU: INHALATION_SOLUTION | RESPIRATORY_TRACT | 1 refills | Status: DC

## 2017-08-27 ENCOUNTER — Telehealth: Payer: Self-pay | Admitting: Internal Medicine

## 2017-08-27 MED ORDER — ALBUTEROL SULFATE (2.5 MG/3ML) 0.083% IN NEBU: INHALATION_SOLUTION | RESPIRATORY_TRACT | 1 refills | Status: DC

## 2017-08-27 MED ORDER — ALBUTEROL SULFATE (2.5 MG/3ML) 0.083% IN NEBU: 2.5000 mg | INHALATION_SOLUTION | Freq: Four times a day (QID) | RESPIRATORY_TRACT | 1 refills | Status: DC | PRN

## 2017-08-27 NOTE — Telephone Encounter (Signed)
Diagnosis code for pt's diagnosis is J44.9.  Called and left a message for [name] stating this to her but also put a new Rx through that had the ICD 10 code on the Rx.  Nothing further needed at this current time.

## 2017-09-12 ENCOUNTER — Telehealth: Payer: Self-pay | Admitting: Internal Medicine

## 2017-09-12 NOTE — Telephone Encounter (Signed)
Called and spoke with patients daughter she wanted to check the status on patients assistance forms for symbicort and spiriva. Spoke with emily and she stated that MR has signed them and she was faxing the forms. Made patients daughter aware. Nothing further needed.

## 2017-09-19 ENCOUNTER — Telehealth: Payer: Self-pay | Admitting: Internal Medicine

## 2017-09-19 NOTE — Telephone Encounter (Signed)
Called and spoke with pt's daughter [name] letting her know I did fax the patient assistance paperwork but as I was looking through things, there was a spiriva page that did get mixed in with the symbicort packet.  I did stated to [name] that the page for the symbicort that required a signature that they stated did not have a symbicort did in fact have a signature on it.  [name] stated to me she would resend everything again to [place] so we can send to the company again that way we can make sure everything does get sent to the correct place.  Will leave this message open and route it to myself so I can follow up on.

## 2017-09-23 NOTE — Telephone Encounter (Signed)
Spoke with Raquel Sarna, she has the forms and will work on them today.   Will leave this message open for her to document on.

## 2017-09-25 NOTE — Telephone Encounter (Signed)
Pt assistance forms were refaxed yesterday, 09/24/17.  Attempted to call pt's daughter letting her know I faxed pt's forms but unable to speak to [place].  Left a detailed message on Robin's VM stating this information. Nothing further needed at this time.

## 2017-10-01 ENCOUNTER — Telehealth: Payer: Self-pay | Admitting: Internal Medicine

## 2017-10-01 NOTE — Telephone Encounter (Signed)
Called and spoke with patients daughter. She states that the [place] called and cannot process the patients assistance for symbicort due to the dosage and strength not being on page 3. Patients daughter is asking that Raquel Sarna give her a call to discuss this. I advised her that I could look into this and help her out, she has requested that [facility] call her and have this changed.    Routing this to EP. Raquel Sarna, do you still have the paperwork so this can be changed.

## 2017-10-02 ENCOUNTER — Other Ambulatory Visit: Payer: Self-pay | Admitting: Internal Medicine

## 2017-10-02 ENCOUNTER — Telehealth: Payer: Self-pay | Admitting: Internal Medicine

## 2017-10-02 NOTE — Telephone Encounter (Signed)
Katie Hunter, please advise if you still have patient's paperwork for Symbicort. Thanks!

## 2017-10-02 NOTE — Telephone Encounter (Signed)
Called and spoke to patient's daughter. Patient's daughter reported that she has been trying to get Symbicort and Spiriva assistance. Daughter stated that Raquel Sarna has already been working on this and that Raquel Sarna told her she would keep copies. Daughter reported that she called BI assistance and they reported they did not have patient's application.  Daughter is worried the Spiriva application might have gone to where the Symbicort was going. Daughter also would like to make sure on the application that the dosage is listed.  Routing to [place] to follow up on.

## 2017-10-03 ENCOUNTER — Other Ambulatory Visit: Payer: Self-pay | Admitting: *Deleted

## 2017-10-03 MED ORDER — BUDESONIDE-FORMOTEROL FUMARATE 80-4.5 MCG/ACT IN AERO: 2.0000 | INHALATION_SPRAY | Freq: Two times a day (BID) | RESPIRATORY_TRACT | 12 refills | Status: DC

## 2017-10-03 MED ORDER — TIOTROPIUM BROMIDE MONOHYDRATE 1.25 MCG/ACT IN AERS: 2.0000 | INHALATION_SPRAY | Freq: Every day | RESPIRATORY_TRACT | 12 refills | Status: DC

## 2017-10-03 NOTE — Telephone Encounter (Signed)
Pt's forms were faxed to [employer] for pt on 3/21.  I will follow up on this after morning clinic today, 10/03/17 and then call and update pt's daughter [name] once I find out why pt still has not received help with med.

## 2017-10-03 NOTE — Telephone Encounter (Signed)
Pt's forms were faxed to [facility] pt assistance for pt on 3/21.  I will follow up on this after morning clinic today, 10/03/17 and then call and update pt's daughter [name] once I find out why pt still has not received help with med.

## 2017-10-03 NOTE — Telephone Encounter (Signed)
Spoke with Raquel Sarna, who states she will f/u on this matter today after clinic today.

## 2017-10-03 NOTE — Telephone Encounter (Addendum)
Called and spoke with pt's daughter [name] letting her know I had spoken with [employer] who stated to me they were needing dosage on symbicort.Marland Kitchen  Spoke with [employer] and they were missing the dosage of pt's symbicort.  I faxed the dose to the urgent fax number I was given.  Robin expressed understanding. Nothing needed at this time.

## 2017-10-03 NOTE — Telephone Encounter (Signed)
Called and spoke with pt's daughter [name] letting her know I had spoken with Linwood Dibbles from [facility] cares who stated to me she had not received anything.  Linwood Dibbles gave me an alternate fax number to try to refax everything to again for pt.  Robin expressed understanding. Will keep encounter open for now until know everything has been taken care of.

## 2017-10-04 NOTE — Telephone Encounter (Signed)
Routing message to [place] for update

## 2017-10-04 NOTE — Telephone Encounter (Signed)
Pt daughter [name] is calling back about paper work for [employer] [phone]    [facility]  Phone number [phone]

## 2017-10-04 NOTE — Telephone Encounter (Signed)
Called and spoke with pt's daughter Katie Hunter who stated she received a message stating all information had been received for pt's Spiriva med and it would take 3-5 days to process all information.  Katie Hunter then stated another message was received stating Katie Hunter had questions for pt's doctor to have doctor call to f/u on med.  Stated to Katie Hunter I would call Katie Hunter to figure out what the issue was and then call her back after I have spoken with Katie Hunter.  Katie Hunter expressed understanding.   Called Katie Hunter and spoke with Katie Hunter to check on status of pt's application status.  Per Katie Hunter, pt is currently enrolled in program until 07/01/18. States an order was received for pt for Spiriva 1.2mcg and they are needing an okay to consolidate refills.  Katie Hunter stated she would transfer me to a representative who could help reconcile pt's refills of med.  Transferred to Katie Hunter to verify pt's address and med along with instructions.  Katie Hunter stated to me in [place], they do 90 day supply for inhaler and we had done a 30-day supply.   Gave Katie Hunter a verbal to change quantity from the 30-day supply to 90-day supply and did 3 refills of med for pt.  Called Katie Hunter letting her know the above information, and Katie Hunter expressed understanding.  Stated to Katie Hunter to call us if anything else was needed. Katie Hunter verbalized understanding.

## 2018-08-02 DEATH — deceased

## 2023-04-09 ENCOUNTER — Telehealth: Payer: Self-pay | Admitting: Internal Medicine

## 2023-04-09 NOTE — Telephone Encounter (Signed)
Patient passed away in Jan 2020 approx 16th per daughter Zella Ball who I ran into in the office with grandaughter

## 2023-04-12 NOTE — Telephone Encounter (Signed)
Patient passed away 07/30/18. Chart has been updated.
